# Patient Record
Sex: Female | Born: 1995 | Race: Black or African American | Hispanic: No | Marital: Single | Smoking: Never smoker
Health system: Southern US, Community
[De-identification: ages and names within clinical notes are randomized; demographics above are authoritative.]

## PROBLEM LIST (undated history)

## (undated) DIAGNOSIS — Z975 Presence of (intrauterine) contraceptive device: Secondary | ICD-10-CM

## (undated) HISTORY — DX: Presence of (intrauterine) contraceptive device: Z97.5

## (undated) HISTORY — PX: IUD REMOVAL: SHX5392

## (undated) HISTORY — PX: INTRAUTERINE DEVICE (IUD) INSERTION: SHX5877

---
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
## 2018-06-10 ENCOUNTER — Encounter: Payer: Self-pay | Admitting: Gastroenterology

## 2018-07-25 ENCOUNTER — Other Ambulatory Visit: Payer: BLUE CROSS/BLUE SHIELD

## 2018-07-25 ENCOUNTER — Other Ambulatory Visit (INDEPENDENT_AMBULATORY_CARE_PROVIDER_SITE_OTHER): Payer: BLUE CROSS/BLUE SHIELD

## 2018-07-25 ENCOUNTER — Ambulatory Visit (INDEPENDENT_AMBULATORY_CARE_PROVIDER_SITE_OTHER): Payer: BLUE CROSS/BLUE SHIELD | Admitting: Gastroenterology

## 2018-07-25 ENCOUNTER — Encounter: Payer: Self-pay | Admitting: Gastroenterology

## 2018-07-25 ENCOUNTER — Encounter (INDEPENDENT_AMBULATORY_CARE_PROVIDER_SITE_OTHER): Payer: Self-pay

## 2018-07-25 VITALS — BP 118/78 | HR 76 | Ht 66.0 in | Wt 164.0 lb

## 2018-07-25 DIAGNOSIS — R1115 Cyclical vomiting syndrome unrelated to migraine: Secondary | ICD-10-CM

## 2018-07-25 DIAGNOSIS — K5909 Other constipation: Secondary | ICD-10-CM

## 2018-07-25 DIAGNOSIS — R634 Abnormal weight loss: Secondary | ICD-10-CM

## 2018-07-25 DIAGNOSIS — R1084 Generalized abdominal pain: Secondary | ICD-10-CM

## 2018-07-25 DIAGNOSIS — G43A0 Cyclical vomiting, not intractable: Secondary | ICD-10-CM | POA: Diagnosis not present

## 2018-07-25 LAB — COMPREHENSIVE METABOLIC PANEL
ALT: 6 U/L (ref 0–35)
AST: 10 U/L (ref 0–37)
Albumin: 4.2 g/dL (ref 3.5–5.2)
Alkaline Phosphatase: 45 U/L (ref 39–117)
BUN: 8 mg/dL (ref 6–23)
CO2: 28 mEq/L (ref 19–32)
Calcium: 9.5 mg/dL (ref 8.4–10.5)
Chloride: 104 mEq/L (ref 96–112)
Creatinine, Ser: 0.83 mg/dL (ref 0.40–1.20)
GFR: 111.42 mL/min (ref >60.00)
Glucose, Bld: 96 mg/dL (ref 70–99)
Potassium: 4.2 mEq/L (ref 3.5–5.1)
Sodium: 138 mEq/L (ref 135–145)
Total Bilirubin: 0.4 mg/dL (ref 0.2–1.2)
Total Protein: 7.3 g/dL (ref 6.0–8.3)

## 2018-07-25 LAB — CBC WITH DIFFERENTIAL/PLATELET
BASOS ABS: 0 10*3/uL (ref 0.0–0.1)
Basophils Relative: 0.5 % (ref 0.0–3.0)
EOS ABS: 0 10*3/uL (ref 0.0–0.7)
Eosinophils Relative: 0.8 % (ref 0.0–5.0)
HEMATOCRIT: 37 % (ref 36.0–46.0)
HEMOGLOBIN: 12.1 g/dL (ref 12.0–15.0)
Lymphocytes Relative: 53.3 % — ABNORMAL HIGH (ref 12.0–46.0)
Lymphs Abs: 2 10*3/uL (ref 0.7–4.0)
MCHC: 32.8 g/dL (ref 30.0–36.0)
MCV: 93.3 fl (ref 78.0–100.0)
MONOS PCT: 5.7 % (ref 3.0–12.0)
Monocytes Absolute: 0.2 10*3/uL (ref 0.1–1.0)
Neutro Abs: 1.5 10*3/uL (ref 1.4–7.7)
Neutrophils Relative %: 39.7 % — ABNORMAL LOW (ref 43.0–77.0)
Platelets: 204 10*3/uL (ref 150.0–400.0)
RBC: 3.96 Mil/uL (ref 3.87–5.11)
RDW: 13.3 % (ref 11.5–15.5)
WBC: 3.8 10*3/uL — ABNORMAL LOW (ref 4.0–10.5)

## 2018-07-25 LAB — TSH: TSH: 0.64 u[IU]/mL (ref 0.35–4.50)

## 2018-07-25 MED ORDER — ONDANSETRON HCL 8 MG PO TABS: 8.0000 mg | ORAL_TABLET | Freq: Three times a day (TID) | ORAL | 2 refills | Status: DC | PRN

## 2018-07-25 NOTE — Progress Notes (Addendum)
[facility] Consult Note:  History: Erin Bowman 07/25/2018  Referring physician: self referred  Reason for consult/chief complaint: Abdominal Pain (with eating); Weight Loss (orig. weight was 215); Constipation (hard to pass BMs); and Emesis (every morning)   Subjective  HPI:  This is a 22 year old woman self-referred for vomiting and weight loss.  This has apparently been going on for at least 9 months, and she does not have a regular medical physician did not seek any medical care for until she went to an urgent care while visiting family in [place] in May of this year.  She says a stool test was done, and she was given prescriptions for omeprazole and ondansetron.  The nausea medicine has helped, but she ran out of it a while ago, and wakes up every morning almost like clockwork at 7:00 with 1 or 2 episodes of vomiting liquid.  She does not vomit undigested food.  She has generalized abdominal dull discomfort that is sometimes worse with meals, and sometimes better with marijuana use.  She tends toward constipation, has bloating and gas loss of appetite.  She also reports substantial weight loss, stating that at some point she was 215 pounds, though it sounds like this may have been a few years back.  She believes she has lost at least 40 pounds over the last year.  She attributes that to this daily vomiting.  She has been a regular user of marijuana daily for years and says she takes it to help settle her stomach.   ROS:  Review of Systems  Constitutional: Negative for appetite change and unexpected weight change.  HENT: Negative for mouth sores and voice change.   Eyes: Negative for pain and redness.  Respiratory: Negative for cough and shortness of breath.   Cardiovascular: Negative for chest pain and palpitations.  Genitourinary: Negative for dysuria and hematuria.  Musculoskeletal: Negative for arthralgias and myalgias.  Skin: Negative for pallor and rash.        She has noticed a "lump" in the left axilla for the last couple of months.  She reports shaving and plucking that area frequently.  Neurological: Negative for weakness and headaches.  Hematological: Negative for adenopathy.   Menses light with IUD  Past Medical History: Past Medical History:  Diagnosis Date  . IUD contraception      Past Surgical History: Past Surgical History:  Procedure Laterality Date  . INTRAUTERINE DEVICE (IUD) INSERTION       Family History: Family History  Problem Relation Age of Onset  . Stomach cancer Maternal Grandfather   . Colon cancer Neg Hx     Social History: Social History   Socioeconomic History  . Marital status: Single    Spouse name: Not on file  . Number of children: Not on file  . Years of education: Not on file  . Highest education level: Not on file  Occupational History  . Not on file  Social Needs  . Financial resource strain: Not on file  . Food insecurity:    Worry: Not on file    Inability: Not on file  . Transportation needs:    Medical: Not on file    Non-medical: Not on file  Tobacco Use  . Smoking status: Current Every Day Smoker  . Smokeless tobacco: Never Used  Substance and Sexual Activity  . Alcohol use: No    Alcohol/week: 0.0 standard drinks  . Drug use: Yes    Types: Marijuana    Comment:  uses daily  . Sexual activity: Yes    Partners: Male  Lifestyle  . Physical activity:    Days per week: Not on file    Minutes per session: Not on file  . Stress: Not on file  Relationships  . Social connections:    Talks on phone: Not on file    Gets together: Not on file    Attends religious service: Not on file    Active member of club or organization: Not on file    Attends meetings of clubs or organizations: Not on file    Relationship status: Not on file  Other Topics Concern  . Not on file  Social History Narrative  . 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 ondansetron (ZOFRAN) 8 MG tablet Take 1 tablet (8 mg total) by mouth every 8 (eight) hours as needed for nausea or vomiting. 45 tablet 2   No current facility-administered medications for this visit.       ___________________________________________________________________ Objective   Exam:  BP 118/78   Pulse 76   Ht 5\' 6"  (1.676 m)   Wt 164 lb (74.4 kg)   BMI 26.47 kg/m  Boyfriend present for the entire encounter  General: this is a(n) well-appearing woman, normal vocal quality, no muscle wasting  Eyes: sclera anicteric, no redness  ENT: oral mucosa moist without lesions, no cervical or supraclavicular lymphadenopathy, good dentition  CV: RRR without murmur, S1/S2, no JVD, no peripheral edema  Resp: clear to auscultation bilaterally, normal RR and effort noted  GI: soft, generalized tenderness (more in the epigastrium and on right side), with active bowel sounds. No guarding or palpable organomegaly noted.  Skin; warm and dry, no rash or jaundice noted.  She has about a 5 x 3 cm soft tissue fullness in the left axilla, normal overlying skin.  It is a little tender.  No abnormality of the right axilla.  Neuro: awake, alert and oriented x 3. Normal gross motor function and fluent speech  Labs:  No recent data for review.  No outside records.  Assessment: Encounter Diagnoses  Name Primary?  . Generalized abdominal pain Yes  . Abnormal loss of weight   . Non-intractable cyclical vomiting with nausea   . Chronic constipation     22 year old woman with a constellation of GI symptoms dating back at least 9 months with substantial reported weight loss and daily vomiting.  She has generalized abdominal pain and altered bowel habits.  It is difficult to tie this altogether in a single diagnosis.  I have brought up the possibility of cannabis hyperemesis, and suggested she stop  using marijuana regularly.  There could be inflammatory, ulcer, neoplastic obstructive causes for symptoms.  Unlikely IBD without chronic diarrhea.  Plan:  CBC, CMP, TSH Upper endoscopy.  She is agreeable after discussion of procedure and risks.  The benefits and risks of the planned procedure were described in detail with the patient or (when appropriate) their health care proxy.  Risks were outlined as including, but not limited to, bleeding, infection, perforation, adverse medication reaction leading to cardiac or pulmonary decompensation, or pancreatitis (if ERCP).  The limitation of incomplete mucosal visualization was also discussed.  No guarantees or warranties were given.  CT abdomen and pelvis with contrast  Ondansetron 8 mg 1 tablet every 8 hours as needed.  I strongly advised her to  stop at the primary care clinic on the first floor of this building to make a new patient appointment for routine health maintenance and also evaluation of this left axillary abnormality.  Thank you for the courtesy of this consult.  Please call me with any questions or concerns.  Charlie Pitter III

## 2018-07-25 NOTE — Patient Instructions (Addendum)
If you are age 21 or older, your body mass index should be between 23-30. Your Body mass index is 26.47 kg/m. If this is out of the aforementioned range listed, please consider follow up with your Primary Care Provider.  If you are age 51 or younger, your body mass index should be between 19-25. Your Body mass index is 26.47 kg/m. If this is out of the aformentioned range listed, please consider follow up with your Primary Care Provider.   You have been scheduled for an endoscopy. Please follow written instructions given to you at your visit today. If you use inhalers (even only as needed), please bring them with you on the day of your procedure. Your physician has requested that you go to www.startemmi.com and enter the access code given to you at your visit today. This web site gives a general overview about your procedure. However, you should still follow specific instructions given to you by our office regarding your preparation for the procedure.  Your provider has requested that you go to the basement level for lab work before leaving today. Press "B" on the elevator. The lab is located at the first door on the left as you exit the elevator.  You have been scheduled for a CT scan of the abdomen and pelvis at [facility] ([address]---this is in the same building as Press [facility]).   You are scheduled on 07-28-2018 at 1130am. You should arrive 15 minutes prior to your appointment time for registration. Please follow the written instructions below on the day of your exam:  WARNING: IF YOU ARE ALLERGIC TO IODINE/X-RAY DYE, PLEASE NOTIFY RADIOLOGY IMMEDIATELY AT [phone]! YOU WILL BE GIVEN A 13 HOUR PREMEDICATION PREP.  1) Do not eat or drink anything after 730am (4 hours prior to your test) 2) You have been given 2 bottles of oral contrast to drink. The solution may taste better if refrigerated, but do NOT add ice or any other liquid to this solution. Shake well before  drinking.    Drink 1 bottle of contrast @ 930am (2 hours prior to your exam)  Drink 1 bottle of contrast @ 1030am (1 hour prior to your exam)  You may take any medications as prescribed with a small amount of water except for the following: Metformin, Glucophage, Glucovance, Avandamet, Riomet, Fortamet, Actoplus Met, Janumet, Glumetza or Metaglip. The above medications must be held the day of the exam AND 48 hours after the exam.  The purpose of you drinking the oral contrast is to aid in the visualization of your intestinal tract. The contrast solution may cause some diarrhea. Before your exam is started, you will be given a small amount of fluid to drink. Depending on your individual set of symptoms, you may also receive an intravenous injection of x-ray contrast/dye. Plan on being at [facility] for 30 minutes or longer, depending on the type of exam you are having performed.  This test typically takes 30-45 minutes to complete.  If you have any questions regarding your exam or if you need to reschedule, you may call the CT department at [phone] between the hours of 8:00 am and 5:00 pm, Monday-Friday.  ________________________________________________________________________    It was a pleasure to see you today!  Dr. Loletha Carrow

## 2018-07-28 ENCOUNTER — Ambulatory Visit (INDEPENDENT_AMBULATORY_CARE_PROVIDER_SITE_OTHER)
Admission: RE | Admit: 2018-07-28 | Discharge: 2018-07-28 | Disposition: A | Discharge status: Home / Self Care | Payer: BLUE CROSS/BLUE SHIELD | Source: Ambulatory Visit | Attending: Gastroenterology | Admitting: Gastroenterology

## 2018-07-28 DIAGNOSIS — R1084 Generalized abdominal pain: Secondary | ICD-10-CM

## 2018-07-28 DIAGNOSIS — R1115 Cyclical vomiting syndrome unrelated to migraine: Secondary | ICD-10-CM

## 2018-07-28 DIAGNOSIS — R634 Abnormal weight loss: Secondary | ICD-10-CM

## 2018-07-28 MED ORDER — IOPAMIDOL (ISOVUE-300) INJECTION 61%
100.0000 mL | Freq: Once | INTRAVENOUS | Status: AC | PRN
  Administered 2018-07-28: 100 mL via INTRAVENOUS

## 2018-07-31 ENCOUNTER — Telehealth: Payer: Self-pay

## 2018-07-31 NOTE — Telephone Encounter (Signed)
CT report sent to Dr Charleen KirksHaygoods office by L.Hunt for consult on IUD misplaced. Per Dr Charleen KirksHaygoods office she is no longer a current patient. Called and confirmed with patient. [facility] [address] [facility]

## 2018-08-05 NOTE — Telephone Encounter (Signed)
Understood, thanks.  Appreciate you setting the patient up for some help.

## 2018-08-21 ENCOUNTER — Encounter: Payer: Self-pay | Admitting: Gastroenterology

## 2018-08-21 ENCOUNTER — Ambulatory Visit (AMBULATORY_SURGERY_CENTER): Payer: BLUE CROSS/BLUE SHIELD | Admitting: Gastroenterology

## 2018-08-21 VITALS — BP 98/53 | HR 59 | Temp 98.4°F | Resp 9 | Ht 66.0 in | Wt 164.0 lb

## 2018-08-21 DIAGNOSIS — R1084 Generalized abdominal pain: Secondary | ICD-10-CM

## 2018-08-21 DIAGNOSIS — R112 Nausea with vomiting, unspecified: Secondary | ICD-10-CM

## 2018-08-21 MED ORDER — SODIUM CHLORIDE 0.9 % IV SOLN: 500.0000 mL | Freq: Once | INTRAVENOUS | Status: DC

## 2018-08-21 NOTE — Op Note (Signed)
[facility] Patient Name: Erin Bowman Procedure Date: 08/21/2018 10:11 AM MRN: [id number] Endoscopist: Sherilyn Cooter L. Myrtie Neither , MD Age: 22 Referring MD:  Date of Birth: [birth year] Gender: Female Account #: [id number] Procedure:                Upper GI endoscopy Indications:              Generalized abdominal pain, Nausea with vomiting,                            Weight loss (normal CTAP, CMP, Albumin, CBC, TSH) Medicines:                Monitored Anesthesia Care Procedure:                Pre-Anesthesia Assessment:                           - Prior to the procedure, a History and Physical                            was performed, and patient medications and                            allergies were reviewed. The patient's tolerance of                            previous anesthesia was also reviewed. The risks                            and benefits of the procedure and the sedation                            options and risks were discussed with the patient.                            All questions were answered, and informed consent                            was obtained. Prior Anticoagulants: The patient has                            taken no previous anticoagulant or antiplatelet                            agents. ASA Grade Assessment: II - A patient with                            mild systemic disease. After reviewing the risks                            and benefits, the patient was deemed in                            satisfactory condition to undergo the procedure.  After obtaining informed consent, the endoscope was                            passed under direct vision. Throughout the                            procedure, the patient's blood pressure, pulse, and                            oxygen saturations were monitored continuously. The                            Endoscope was introduced through the mouth, and   advanced to the second part of duodenum. The upper                            GI endoscopy was accomplished without difficulty.                            The patient tolerated the procedure well. Scope In: Scope Out: Findings:                 The larynx was normal.                           The esophagus was normal.                           The entire examined stomach was normal. This was                            biopsied with a cold forceps for Helicobacter                            pylori testing using CLOtest.                           The cardia and gastric fundus were normal on                            retroflexion.                           The examined duodenum was normal. Complications:            No immediate complications. Estimated Blood Loss:     Estimated blood loss was minimal. Impression:               - Normal larynx.                           - Normal esophagus.                           - Normal stomach. Biopsied.                           - Normal examined duodenum. Recommendation:           -  Patient has a contact number available for                            emergencies. The signs and symptoms of potential                            delayed complications were discussed with the                            patient. Return to normal activities tomorrow.                            Written discharge instructions were provided to the                            patient.                           - Resume previous diet.                           - Continue present medications.                           - Await pathology results.                           - Stop cannabis use. Henry L. Myrtie Neitheranis, MD 08/21/2018 10:23:32 AM This report has been signed electronically.

## 2018-08-21 NOTE — Patient Instructions (Signed)
YOU HAD AN ENDOSCOPIC PROCEDURE TODAY AT THE [facility]:   Refer to the procedure report that was given to you for any specific questions about what was found during the examination.  If the procedure report does not answer your questions, please call your gastroenterologist to clarify.  If you requested that your care partner not be given the details of your procedure findings, then the procedure report has been included in a sealed envelope for you to review at your convenience later.  YOU SHOULD EXPECT: Some feelings of bloating in the abdomen. Passage of more gas than usual.  Walking can help get rid of the air that was put into your GI tract during the procedure and reduce the bloating. If you had a lower endoscopy (such as a colonoscopy or flexible sigmoidoscopy) you may notice spotting of blood in your stool or on the toilet paper. If you underwent a bowel prep for your procedure, you may not have a normal bowel movement for a few days.  Please Note:  You might notice some irritation and congestion in your nose or some drainage.  This is from the oxygen used during your procedure.  There is no need for concern and it should clear up in a day or so.  SYMPTOMS TO REPORT IMMEDIATELY:   Following upper endoscopy (EGD)  Vomiting of blood or coffee ground material  New chest pain or pain under the shoulder blades  Painful or persistently difficult swallowing  New shortness of breath  Fever of 100F or higher  Black, tarry-looking stools  For urgent or emergent issues, a gastroenterologist can be reached at any hour by calling [phone].   DIET:  We do recommend a small meal at first, but then you may proceed to your regular diet.  Drink plenty of fluids but you should avoid alcoholic beverages for 24 hours.  ACTIVITY:  You should plan to take it easy for the rest of today and you should NOT DRIVE or use heavy machinery until tomorrow (because of the sedation medicines used  during the test).    FOLLOW UP: Our staff will call the number listed on your records the next business day following your procedure to check on you and address any questions or concerns that you may have regarding the information given to you following your procedure. If we do not reach you, we will leave a message.  However, if you are feeling well and you are not experiencing any problems, there is no need to return our call.  We will assume that you have returned to your regular daily activities without incident.  If any biopsies were taken you will be contacted by phone or by letter within the next 1-3 weeks.  Please call us at [phone] if you have not heard about the biopsies in 3 weeks.    SIGNATURES/CONFIDENTIALITY: You and/or your care partner have signed paperwork which will be entered into your electronic medical record.  These signatures attest to the fact that that the information above on your After Visit Summary has been reviewed and is understood.  Full responsibility of the confidentiality of this discharge information lies with you and/or your care-partner. 

## 2018-08-21 NOTE — Progress Notes (Signed)
Called to room to assist during endoscopic procedure.  Patient ID and intended procedure confirmed with present staff. Received instructions for my participation in the procedure from the performing physician.  

## 2018-08-21 NOTE — Progress Notes (Signed)
Report given to PACU, vss 

## 2018-08-22 ENCOUNTER — Telehealth: Payer: Self-pay

## 2018-08-22 ENCOUNTER — Telehealth: Payer: Self-pay | Admitting: *Deleted

## 2018-08-22 LAB — HELICOBACTER PYLORI SCREEN-BIOPSY: UREASE: NEGATIVE

## 2018-08-22 NOTE — Telephone Encounter (Signed)
Second post procedure phone call, no answer 

## 2018-08-22 NOTE — Telephone Encounter (Signed)
  Follow up Call-  Call back number 08/21/2018  Post procedure Call Back phone  # [phone]  Permission to leave phone message Yes  Some recent data might be hidden     Patient questions:  Message left to call us if necessary.

## 2018-10-09 ENCOUNTER — Telehealth: Payer: Self-pay | Admitting: Gastroenterology

## 2018-10-09 NOTE — Telephone Encounter (Signed)
Spoke to patient, she states that every morning she feels sick and vomits. She does not have an appetite, states she can "go 2 days and not eat". She is taking the Zofran, helps some. She has stopped cannabis use. She went to urgent care and they recommended she contact our office.

## 2018-10-09 NOTE — Telephone Encounter (Signed)
Please schedule non-contrast CT scan of the head and a Gastric emptying study, then follow up with me.

## 2018-10-09 NOTE — Telephone Encounter (Signed)
Patient states she is still sick and keeps vomiting. Patient wanting advice. Pt had egd 9.19.19.

## 2018-10-10 ENCOUNTER — Other Ambulatory Visit: Payer: Self-pay

## 2018-10-10 DIAGNOSIS — R112 Nausea with vomiting, unspecified: Secondary | ICD-10-CM

## 2018-10-10 NOTE — Telephone Encounter (Signed)
Patient scheduled for tests and follow up visit. Mailed her appointment information, also verbally gave her instructions.

## 2018-10-24 ENCOUNTER — Ambulatory Visit (HOSPITAL_COMMUNITY)
Admission: RE | Admit: 2018-10-24 | Discharge: 2018-10-24 | Disposition: A | Discharge status: Home / Self Care | Payer: BLUE CROSS/BLUE SHIELD | Source: Ambulatory Visit | Attending: Gastroenterology | Admitting: Gastroenterology

## 2018-10-24 ENCOUNTER — Encounter (HOSPITAL_COMMUNITY)
Admission: RE | Admit: 2018-10-24 | Discharge: 2018-10-24 | Disposition: A | Discharge status: Home / Self Care | Payer: BLUE CROSS/BLUE SHIELD | Source: Ambulatory Visit | Attending: Gastroenterology | Admitting: Gastroenterology

## 2018-10-24 DIAGNOSIS — R112 Nausea with vomiting, unspecified: Secondary | ICD-10-CM | POA: Diagnosis not present

## 2018-10-24 MED ORDER — TECHNETIUM TC 99M SULFUR COLLOID
1.9500 | Freq: Once | INTRAVENOUS | Status: AC | PRN
  Administered 2018-10-24: 1.95 via INTRAVENOUS

## 2018-11-04 ENCOUNTER — Encounter: Payer: Self-pay | Admitting: Gastroenterology

## 2018-11-04 ENCOUNTER — Ambulatory Visit (INDEPENDENT_AMBULATORY_CARE_PROVIDER_SITE_OTHER): Payer: BLUE CROSS/BLUE SHIELD | Admitting: Gastroenterology

## 2018-11-04 VITALS — BP 104/78 | HR 83 | Ht 66.0 in | Wt 154.0 lb

## 2018-11-04 DIAGNOSIS — R1084 Generalized abdominal pain: Secondary | ICD-10-CM

## 2018-11-04 DIAGNOSIS — R112 Nausea with vomiting, unspecified: Secondary | ICD-10-CM | POA: Diagnosis not present

## 2018-11-04 MED ORDER — METOCLOPRAMIDE HCL 5 MG PO TABS: 5.0000 mg | ORAL_TABLET | ORAL | 0 refills | Status: DC

## 2018-11-04 NOTE — Progress Notes (Signed)
[facility] Progress Note  Chief Complaint: Vomiting and abdominal pain  Subjective  History:  Erin Bowman was seen in August for consultation regarding chronic abdominal pain and vomiting that almost always occurs in the morning.  I felt that chronic marijuana use might be contributing, and she has since stopped that.  Extensive labs, CT abdomen and pelvis, and upper endoscopy with CLOtest were normal.  She continued to have symptoms despite use of Zofran,she contacted our office, and further testing was scheduled.  She has since had a negative head CT and gastric emptying study.  Erin Bowman feels much the same as before, particularly with a.m. vomiting.  She still has some intermittent crampy abdominal pain that is fairly generalized with no clear triggers or relieving factors.  Bowel habits are regular.  She wakes early in the morning and feels nauseated, and within an hour she vomited several times.  Zofran 8 mg seems to decrease the severity, but it still happens every day.  She then feels gradually better, and by 11 in the morning is able to eat food including salad.  She is then good the rest of the day until symptoms recur the next morning. She completely stopped using marijuana.  Her appetite is improved and her weight stabilized.  ROS: Cardiovascular:  no chest pain Respiratory: no dyspnea Remainder of systems negative except as above The patient's Past Medical, Family and Social History were reviewed and are on file in the EMR.  Objective:  Med list reviewed  Current Outpatient Medications:  .  metoCLOPramide (REGLAN) 5 MG tablet, Take 1 tablet (5 mg total) by mouth every morning., Disp: 21 tablet, Rfl: 0 .  ondansetron (ZOFRAN) 8 MG tablet, Take 1 tablet (8 mg total) by mouth every 8 (eight) hours as needed for nausea or vomiting., Disp: 45 tablet, Rfl: 2   Vital signs in last 24 hrs: Vitals:   11/04/18 1406  BP: 104/78  Pulse: 83  SpO2: 98%    Physical Exam  She  is well-appearing with good muscle mass  HEENT: sclera anicteric, oral mucosa moist without lesions  Neck: supple, no thyromegaly, JVD or lymphadenopathy  Cardiac: RRR without murmurs, S1S2 heard, no peripheral edema  Pulm: clear to auscultation bilaterally, normal RR and effort noted  Abdomen: soft, no tenderness, with active bowel sounds. No guarding or palpable hepatosplenomegaly.  Skin; warm and dry, no jaundice or rash  Recent data:  Testing as noted above   @ASSESSMENTPLANBEGIN @ Assessment: Encounter Diagnoses  Name Primary?  . Nausea and vomiting, intractability of vomiting not specified, unspecified vomiting type Yes  . Generalized abdominal pain     Puzzling case of nausea with completely normal extensive work-up, exclusively occurs in the morning.  Wonder if she may have some gastric dysrhythmia functional disorder.  She denies chronic anxiety as a trigger.  Plan:  Brief trial of metoclopramide 5 mg at bedtime.  If it makes her feel jittery, she can take it instead in the morning upon awakening.  We will do this for 2 to 3 weeks.  She can take Zofran later in the morning if needed.  I discussed the possible risk of tardive dyskinesia, and both described and demonstrated what this might look like.  If it occurs, she should take 2 Benadryl tablets immediately and let us know.  If this is not helpful, then a trial of buspirone might be in order before considering [facility] consultation.  Call in 2 or 3 weeks with an update, follow-up  in 6 weeks.  Total time 25 minutes, over half spent face-to-face with patient in counseling and coordination of care.   Charlie PitterHenry L Danis III

## 2018-11-04 NOTE — Patient Instructions (Signed)
If you are age 21 or older, your body mass index should be between 23-30. Your Body mass index is 24.86 kg/m. If this is out of the aforementioned range listed, please consider follow up with your Primary Care Provider.  If you are age 21 or younger, your body mass index should be between 19-25. Your Body mass index is 24.86 kg/m. If this is out of the aformentioned range listed, please consider follow up with your Primary Care Provider.   We have sent the following medications to your pharmacy for you to pick up at your convenience:  Reglan   It was a pleasure to see you today!  Dr. Myrtie Neitheranis

## 2018-11-07 ENCOUNTER — Telehealth: Payer: Self-pay | Admitting: Gastroenterology

## 2018-11-07 NOTE — Telephone Encounter (Signed)
Pt calling stating that she took the reglan at night and could not get up in the morning, reports she missed work. Tried taking it in the am and could not work today. States medicine does not help and is requesting a note excusing her from work for yesterday and today. Please advise.

## 2018-11-07 NOTE — Telephone Encounter (Signed)
Sorry to hear the medicine did not agree with her. Yes, please provide a work excuse note as requested.    If she would like to proceed with referral to [facility] as discussed at recent visit, then please send the referral along with my notes and scope/imaging reports.

## 2018-11-07 NOTE — Telephone Encounter (Signed)
Pt aware, letter up front for pt to pick up. Pt states she is going to discuss referral with her mother and will call back and let us know if she wishes to proceed with referral.

## 2018-11-07 NOTE — Telephone Encounter (Signed)
Selinda MichaelsLinda Hunt is now the nurse for Dr. Myrtie Neitheranis.  Please route accordingly

## 2018-11-20 ENCOUNTER — Telehealth: Payer: Self-pay | Admitting: Gastroenterology

## 2018-11-20 NOTE — Telephone Encounter (Signed)
Attempted to call pt on home numberX2 and get busy signal. Attempted cell number and received message that voicemail box has not been set up yet. Will try again.

## 2018-11-20 NOTE — Telephone Encounter (Signed)
Attempted to call pt again and still getting busy signal.

## 2018-11-20 NOTE — Telephone Encounter (Signed)
Pt says she's having side effects from Reglan and it's affecting her work.

## 2018-11-21 NOTE — Telephone Encounter (Signed)
Attempted to call cell number and received message that voicemail box has not been set up yet.

## 2018-11-21 NOTE — Telephone Encounter (Signed)
Attempted to call pt again and still getting a busy signal.

## 2018-11-21 NOTE — Telephone Encounter (Signed)
Line busy

## 2018-11-24 ENCOUNTER — Telehealth: Payer: Self-pay | Admitting: Gastroenterology

## 2018-11-24 NOTE — Telephone Encounter (Signed)
See additional phone note. 

## 2018-11-24 NOTE — Telephone Encounter (Signed)
Pt called and states the reglan made her feel bad so she stopped taking it. Reports she is still vomiting in the mornings. States the zofran stops the vomiting but she still feels nauseated. Let pt know that Dr. Myrtie Neitheranis is out of the office until next week. Pt is aware and knows we will get back to her when Dr. Myrtie Neitheranis can review.

## 2018-11-29 NOTE — Telephone Encounter (Signed)
I am sorry to hear that the medicine did not agree with her.  If she would like to proceed with the referral to [facility] as we discussed, then please send referral and forward records to Dr. Gibson RampNyree Thorne at [facility].   Zofran refills as needed.

## 2018-12-05 NOTE — Telephone Encounter (Signed)
Records faxed to Dr. Gibson Ramp at [facility] [phone].

## 2018-12-09 ENCOUNTER — Telehealth: Payer: Self-pay

## 2018-12-09 NOTE — Telephone Encounter (Signed)
Pt has been scheduled for an appt with Dr. Jacinto Reap 01/27/19@9 :30am.

## 2018-12-31 ENCOUNTER — Ambulatory Visit: Payer: BLUE CROSS/BLUE SHIELD | Admitting: Family Medicine

## 2018-12-31 DIAGNOSIS — Z0289 Encounter for other administrative examinations: Secondary | ICD-10-CM

## 2019-05-19 ENCOUNTER — Telehealth: Payer: Self-pay

## 2019-05-19 NOTE — Telephone Encounter (Signed)
The patient called in to schedule an appointment. Informed of wearing a face mask, sanitzing of hands once she enters the clinic, no visitors due to the [place] restrictions. The patient verbalized understanding.

## 2019-05-28 ENCOUNTER — Ambulatory Visit: Payer: BLUE CROSS/BLUE SHIELD

## 2019-05-28 ENCOUNTER — Telehealth: Payer: Self-pay | Admitting: Family Medicine

## 2019-05-28 NOTE — Telephone Encounter (Signed)
Called patient to let to let her know that we had to cancel her appointment, and reschedule due to illness within the office

## 2019-06-03 ENCOUNTER — Ambulatory Visit: Payer: BLUE CROSS/BLUE SHIELD

## 2019-08-15 ENCOUNTER — Encounter (HOSPITAL_COMMUNITY): Payer: Self-pay | Admitting: Emergency Medicine

## 2019-08-15 ENCOUNTER — Emergency Department (HOSPITAL_COMMUNITY)
Admission: EM | Admit: 2019-08-15 | Discharge: 2019-08-15 | Disposition: A | Discharge status: Home / Self Care | Payer: BLUE CROSS/BLUE SHIELD | Attending: Emergency Medicine | Admitting: Emergency Medicine

## 2019-08-15 ENCOUNTER — Other Ambulatory Visit: Payer: Self-pay

## 2019-08-15 DIAGNOSIS — M546 Pain in thoracic spine: Secondary | ICD-10-CM

## 2019-08-15 DIAGNOSIS — Z79899 Other long term (current) drug therapy: Secondary | ICD-10-CM | POA: Insufficient documentation

## 2019-08-15 LAB — URINALYSIS, ROUTINE W REFLEX MICROSCOPIC
Bilirubin Urine: NEGATIVE
Glucose, UA: NEGATIVE mg/dL
Hgb urine dipstick: NEGATIVE
Ketones, ur: NEGATIVE mg/dL
Leukocytes,Ua: NEGATIVE
Nitrite: NEGATIVE
Protein, ur: NEGATIVE mg/dL
Specific Gravity, Urine: 1.011 (ref 1.005–1.030)
pH: 7 (ref 5.0–8.0)

## 2019-08-15 LAB — POC URINE PREG, ED: Preg Test, Ur: NEGATIVE

## 2019-08-15 MED ORDER — CYCLOBENZAPRINE HCL 5 MG PO TABS: 5.0000 mg | ORAL_TABLET | Freq: Three times a day (TID) | ORAL | 0 refills | Status: DC | PRN

## 2019-08-15 MED ORDER — NAPROXEN 375 MG PO TABS: 375.0000 mg | ORAL_TABLET | Freq: Two times a day (BID) | ORAL | 0 refills | Status: DC

## 2019-08-15 NOTE — ED Provider Notes (Signed)
[facility]-EMERGENCY DEPT Provider Note   CSN: [id number] Arrival date & time: 08/15/19  1023     History   Chief Complaint Chief Complaint  Patient presents with  . Back Pain    HPI Erin Bowman is a 23 y.o. female with no pertinent  PMH here today with c/o back pain. Patient reports back pain off and on for 2 years that got worse in the past couple weeks. Patient reports no loss of control of bladder or bowels. Took 2 ibuprofen yesterday without relief.      The history is provided by the patient.  Back Pain Location:  Thoracic spine Quality:  Burning, aching and stabbing Radiates to:  Does not radiate Pain severity:  Severe Pain is:  Same all the time Onset quality:  Gradual Duration:  2 weeks Chronicity:  Chronic Context: not emotional stress, not falling, not jumping from heights, not lifting heavy objects, not MVA, not occupational injury, not physical stress and not recent injury   Relieved by:  Nothing Worsened by:  Coughing, twisting and movement Ineffective treatments:  Ibuprofen Associated symptoms: no abdominal pain, no bladder incontinence, no bowel incontinence, no chest pain, no dysuria, no fever, no headaches, no leg pain, no numbness, no pelvic pain, no tingling, no weakness and no weight loss   Risk factors: lack of exercise   Risk factors: no hx of cancer, not pregnant, no recent surgery and no steroid use     Past Medical History:  Diagnosis Date  . IUD contraception     There are no active problems to display for this patient.   Past Surgical History:  Procedure Laterality Date  . INTRAUTERINE DEVICE (IUD) INSERTION    . IUD REMOVAL       OB History   No obstetric history on file.      Home Medications    Prior to Admission medications   Medication Sig Start Date End Date Taking? Authorizing Provider  cyclobenzaprine (FLEXERIL) 5 MG tablet Take 1 tablet (5 mg total) by mouth 3 (three) times daily as needed for  muscle spasms. 08/15/19   Janne NapoleonNeese, Etrulia Zarr M, NP  metoCLOPramide (REGLAN) 5 MG tablet Take 1 tablet (5 mg total) by mouth every morning. 11/04/18   Sherrilyn Ristanis, Henry L III, MD  naproxen (NAPROSYN) 375 MG tablet Take 1 tablet (375 mg total) by mouth 2 (two) times daily. 08/15/19   Janne NapoleonNeese, Kimberli Winne M, NP  ondansetron (ZOFRAN) 8 MG tablet Take 1 tablet (8 mg total) by mouth every 8 (eight) hours as needed for nausea or vomiting. 07/25/18   Sherrilyn Ristanis, Henry L III, MD    Family History Family History  Problem Relation Age of Onset  . Stomach cancer Maternal Grandfather   . Colon cancer Neg Hx   . Esophageal cancer Neg Hx   . Rectal cancer Neg Hx     Social History Social History   Tobacco Use  . Smoking status: Never Smoker  . Smokeless tobacco: Never Used  Substance Use Topics  . Alcohol use: No    Alcohol/week: 0.0 standard drinks  . Drug use: Yes    Types: Marijuana    Comment: uses daily     Allergies   Patient has no known allergies.   Review of Systems Review of Systems  Constitutional: Negative for chills, fever and weight loss.  HENT: Negative.   Eyes: Negative for discharge, redness and visual disturbance.  Respiratory: Negative for cough, chest tightness and shortness of breath.  Cardiovascular: Negative for chest pain.  Gastrointestinal: Negative for abdominal pain and bowel incontinence.  Genitourinary: Negative for bladder incontinence, dysuria, flank pain, frequency, pelvic pain, urgency, vaginal bleeding and vaginal discharge.  Musculoskeletal: Positive for back pain.  Skin: Negative for rash.  Neurological: Negative for tingling, weakness, numbness and headaches.  Psychiatric/Behavioral: Negative for confusion. The patient is not nervous/anxious.      Physical Exam Updated Vital Signs BP 116/68 (BP Location: Left Arm)   Pulse 74   Temp 98.2 F (36.8 C) (Oral)   Resp 17   LMP 07/17/2019   SpO2 100%   Physical Exam Vitals signs and nursing note reviewed.   Constitutional:      Appearance: She is well-developed and normal weight.  HENT:     Head: Normocephalic.  Eyes:     Conjunctiva/sclera: Conjunctivae normal.  Neck:     Musculoskeletal: Normal range of motion and neck supple.  Cardiovascular:     Rate and Rhythm: Normal rate and regular rhythm.  Pulmonary:     Effort: Pulmonary effort is normal.     Breath sounds: Normal breath sounds.  Abdominal:     General: Bowel sounds are normal.     Palpations: Abdomen is soft.     Tenderness: There is no abdominal tenderness.  Musculoskeletal:     Thoracic back: She exhibits tenderness. She exhibits normal range of motion, no deformity, no laceration, no spasm and normal pulse.     Comments: Radial and pedal pulses 2+, straight leg raises without difficulty. Full range of motion of back without difficulty but patient states pain with bending forward and twisting.   Skin:    General: Skin is warm and dry.  Neurological:     Mental Status: She is alert and oriented to person, place, and time.     Sensory: Sensation is intact.     Motor: No weakness.     Coordination: Romberg sign negative.     Gait: Gait is intact.     Deep Tendon Reflexes:     Reflex Scores:      Bicep reflexes are 2+ on the right side and 2+ on the left side.      Brachioradialis reflexes are 2+ on the right side and 2+ on the left side.      Patellar reflexes are 2+ on the right side and 2+ on the left side. Psychiatric:        Mood and Affect: Mood normal.      ED Treatments / Results  Labs (all labs ordered are listed, but only abnormal results are displayed) Labs Reviewed  URINALYSIS, [place] PREG, ED    Radiology No results found.  Procedures Procedures (including critical care time)  Medications Ordered in ED Medications - No data to display   Initial Impression / Assessment and Plan / ED Course  I have reviewed the triage vital signs and the nursing notes.  Patient with back pain.  No neurological deficits and normal neuro exam.  Patient can walk without pain.  No loss of bowel or bladder control.  No concern for cauda equina.  No fever, night sweats, weight loss, h/o cancer, IVDU.  RICE protocol and pain medicine indicated and discussed with patient.  23 y.o. female here with back pain that has been off x 2 years and for the past 2 weeks has had upper back pain. Stable for d/c Will treat with muscle relaxer and NSAIDS. Return precautions.  Final Clinical Impressions(s) / ED Diagnoses   Final diagnoses:  Acute bilateral thoracic back pain    ED Discharge Orders         Ordered    cyclobenzaprine (FLEXERIL) 5 MG tablet  3 times daily PRN     08/15/19 1151    naproxen (NAPROSYN) 375 MG tablet  2 times daily     08/15/19 1151           [place], [place], NP 08/15/19 1200    Melene Plan, DO 08/15/19 1219

## 2019-08-15 NOTE — ED Triage Notes (Signed)
Pt c/o back pains x 2 years that got worse in the past couple weeks. Pt denies any new fall or injuries.

## 2019-08-15 NOTE — Discharge Instructions (Addendum)
Take the medication as directed. Do not take the muscle relaxer if driving as it will make you sleepy. Follow up with [facility]. Return here as needed.

## 2019-10-01 ENCOUNTER — Emergency Department (HOSPITAL_COMMUNITY): Payer: Self-pay

## 2019-10-01 ENCOUNTER — Other Ambulatory Visit: Payer: Self-pay

## 2019-10-01 ENCOUNTER — Encounter (HOSPITAL_COMMUNITY): Payer: Self-pay | Admitting: Emergency Medicine

## 2019-10-01 ENCOUNTER — Emergency Department (HOSPITAL_COMMUNITY)
Admission: EM | Admit: 2019-10-01 | Discharge: 2019-10-01 | Disposition: A | Discharge status: Home / Self Care | Payer: Self-pay | Attending: Emergency Medicine | Admitting: Emergency Medicine

## 2019-10-01 DIAGNOSIS — M545 Low back pain, unspecified: Secondary | ICD-10-CM

## 2019-10-01 DIAGNOSIS — G8929 Other chronic pain: Secondary | ICD-10-CM | POA: Insufficient documentation

## 2019-10-01 DIAGNOSIS — Z791 Long term (current) use of non-steroidal anti-inflammatories (NSAID): Secondary | ICD-10-CM | POA: Insufficient documentation

## 2019-10-01 DIAGNOSIS — Z975 Presence of (intrauterine) contraceptive device: Secondary | ICD-10-CM | POA: Insufficient documentation

## 2019-10-01 DIAGNOSIS — Z79899 Other long term (current) drug therapy: Secondary | ICD-10-CM | POA: Insufficient documentation

## 2019-10-01 LAB — I-STAT BETA HCG BLOOD, ED (MC, WL, AP ONLY): I-stat hCG, quantitative: <5 m[IU]/mL (ref <5)

## 2019-10-01 MED ORDER — IBUPROFEN 800 MG PO TABS
800.0000 mg | ORAL_TABLET | Freq: Once | ORAL | Status: AC
  Administered 2019-10-01: 800 mg via ORAL
  Filled 2019-10-01: qty 1

## 2019-10-01 MED ORDER — DICLOFENAC SODIUM 1 % TD GEL: 2.0000 g | Freq: Four times a day (QID) | TRANSDERMAL | 0 refills | Status: DC

## 2019-10-01 NOTE — ED Provider Notes (Signed)
[facility] DEPT Provider Note   CSN: [id number] Arrival date & time: 10/01/19  1120     History   Chief Complaint Chief Complaint  Patient presents with  . Back Pain    HPI Erin Bowman is a 23 y.o. female with no significant past medical history who presents to the ED due to chronic low back pain x 1 year. Patient notes her pain is a constant, burning sensation in her low back and mid back. Patient was seen in the ED in September for the same issue and was discharged home with symptomatic treatment, but she notes her pain has not improved. She has tried OTC back pain relief with no relief. She notes cracking her back causes relief. Pain is worse when she first wakes up in the morning x 6 months. She denies hx cancer, IVDU, numbess/tingling, fever, chills. No injects in back. No recent steroid use. She does note she has had a significant amount of weight loss over the past 8 months, but was evaluated by GI who found no cause. She also notes chronic shortness of breath and cough x 6 months. Patient denies bowel/bladder incontinence, saddle paresthesias, chest pain, and abdominal pain.   Past Medical History:  Diagnosis Date  . IUD contraception     There are no active problems to display for this patient.   Past Surgical History:  Procedure Laterality Date  . INTRAUTERINE DEVICE (IUD) INSERTION    . IUD REMOVAL       OB History   No obstetric history on file.      Home Medications    Prior to Admission medications   Medication Sig Start Date End Date Taking? Authorizing Provider  cyclobenzaprine (FLEXERIL) 5 MG tablet Take 1 tablet (5 mg total) by mouth 3 (three) times daily as needed for muscle spasms. 08/15/19   Ashley Murrain, NP  diclofenac sodium (VOLTAREN) 1 % GEL Apply 2 g topically 4 (four) times daily. 10/01/19   Cheek, Comer Locket, PA-C  metoCLOPramide (REGLAN) 5 MG tablet Take 1 tablet (5 mg total) by mouth every morning. 11/04/18    Doran Stabler, MD  naproxen (NAPROSYN) 375 MG tablet Take 1 tablet (375 mg total) by mouth 2 (two) times daily. 08/15/19   Ashley Murrain, NP  ondansetron (ZOFRAN) 8 MG tablet Take 1 tablet (8 mg total) by mouth every 8 (eight) hours as needed for nausea or vomiting. 07/25/18   Doran Stabler, MD    Family History Family History  Problem Relation Age of Onset  . Stomach cancer Maternal Grandfather   . Colon cancer Neg Hx   . Esophageal cancer Neg Hx   . Rectal cancer Neg Hx     Social History Social History   Tobacco Use  . Smoking status: Never Smoker  . Smokeless tobacco: Never Used  Substance Use Topics  . Alcohol use: No    Alcohol/week: 0.0 standard drinks  . Drug use: Yes    Types: Marijuana    Comment: uses daily     Allergies   Patient has no known allergies.   Review of Systems Review of Systems  Constitutional: Negative for chills and fever.  Respiratory: Negative for shortness of breath.   Cardiovascular: Negative for chest pain.  Gastrointestinal: Negative for abdominal pain.  Musculoskeletal: Positive for back pain and myalgias. Negative for gait problem, neck pain and neck stiffness.  Skin: Negative for color change.  All other systems reviewed and  are negative.    Physical Exam Updated Vital Signs BP 122/81   Pulse 86   Temp 98.5 F (36.9 C)   Resp 14   SpO2 98%   Physical Exam Constitutional:      General: She is not in acute distress.    Appearance: She is not ill-appearing.  HENT:     Head: Normocephalic.  Neck:     Musculoskeletal: Neck supple.  Cardiovascular:     Rate and Rhythm: Normal rate and regular rhythm.     Pulses: Normal pulses.     Heart sounds: Normal heart sounds. No murmur. No friction rub. No gallop.   Pulmonary:     Effort: Pulmonary effort is normal.     Breath sounds: Normal breath sounds.  Abdominal:     General: Abdomen is flat. Bowel sounds are normal. There is no distension.     Palpations: Abdomen  is soft.  Musculoskeletal:     Comments: Midline tenderness in thoracic and lumbar regions. More significant in paraspinal region. No deformity. No step-off. Full ROM of back. Ambulates without difficulty. Negative straight leg test. Distal pulses and sensation intact bilaterally.  Skin:    General: Skin is warm.  Neurological:     General: No focal deficit present.     Mental Status: She is alert.      ED Treatments / Results  Labs (all labs ordered are listed, but only abnormal results are displayed) Labs Reviewed  I-STAT BETA HCG BLOOD, ED ([facility], [facility], [facility] ONLY)    EKG None  Radiology Dg Chest 2 View  Result Date: 10/01/2019 CLINICAL DATA:  Shortness of breath and cough for 6 months EXAM: CHEST - 2 VIEW COMPARISON:  None. FINDINGS: The heart size and mediastinal contours are within normal limits. Both lungs are clear. The visualized skeletal structures are unremarkable. IMPRESSION: No active cardiopulmonary disease. Electronically Signed   By: Jonna Clark M.D.   On: 10/01/2019 13:13   Dg Lumbar Spine Complete  Result Date: 10/01/2019 CLINICAL DATA:  Lower back pain EXAM: LUMBAR SPINE - COMPLETE 4+ VIEW COMPARISON:  None. FINDINGS: There is no evidence of lumbar spine fracture. Alignment is normal. Intervertebral disc spaces are maintained. IMPRESSION: Negative. Electronically Signed   By: Jonna Clark M.D.   On: 10/01/2019 13:15    Procedures Procedures (including critical care time)  Medications Ordered in ED Medications  ibuprofen (ADVIL) tablet 800 mg (800 mg Oral Given 10/01/19 1237)     Initial Impression / Assessment and Plan / ED Course  I have reviewed the triage vital signs and the nursing notes.  Pertinent labs & imaging results that were available during my care of the patient were reviewed by me and considered in my medical decision making (see chart for details).       Erin Bowman is a 23 year old female who presents to the ED for an evaluation of low  back pain x 1 year. Patient denies trauma. She was seen in the ED 2 months ago and treated symptomatically for back pain. Vitals reviewed and all WNL. Patient is in no acute distress and non-ill appearing. On physical exam patient has midline tenderness in thoracic and lumbar region, but more significant tenderness in paraspinal muscle region. Full back ROM. Able to ambulate without difficulty. Patient is pretty addiment about getting an x-ray. Will obtain lumbar x-ray. Given she has had a chronic cough x 6 months will order CXR.  I have personally reviewed the films. CXR negative.  Lumbar x-ray negative for bony fractures. Discussed results with patient. Will treat symptomatically with voltaren gel with neurosurgery follow-up. Advised patient to follow-up with PCP to evaluate weight loss further. Strict ED precautions discussed with patient. Patient states understanding and agrees to plan. Patient discharged home in no acute distress and vitals within normal limits.  Discussed case with Dr. Silverio LayYao who agrees with assessment and plan Final Clinical Impressions(s) / ED Diagnoses   Final diagnoses:  Chronic bilateral low back pain without sciatica    ED Discharge Orders         Ordered    diclofenac sodium (VOLTAREN) 1 % GEL  4 times daily     10/01/19 1324           Renee HarderCheek, Caroline B, PA-C 10/01/19 1732    Charlynne PanderYao, David Hsienta, MD 10/02/19 [phone]

## 2019-10-01 NOTE — ED Triage Notes (Signed)
Patient reports back pain x1 year. States seen x2 months ago and prescribed muscle relaxer without relief. Denies urinary symptoms. Ambulatory.

## 2019-10-01 NOTE — ED Notes (Signed)
ED Provider at bedside. 

## 2019-10-01 NOTE — ED Notes (Signed)
Patient transported to X-ray 

## 2019-10-01 NOTE — Discharge Instructions (Signed)
As discussed, you can take ibuprofen for back pain. I am prescribing you voltaren gel. Apply to the affected areas as needed for pain. Call and make an appointment with neurosurgery for further evaluation of back pain. I am also giving you a number for a primary care doctor. Call and make an appointment for further evaluation of chronic cough and weight loss. Do back exercises daily that I have provided.

## 2019-11-13 ENCOUNTER — Other Ambulatory Visit: Payer: Self-pay

## 2019-11-13 ENCOUNTER — Emergency Department (HOSPITAL_COMMUNITY)
Admission: EM | Admit: 2019-11-13 | Discharge: 2019-11-13 | Disposition: A | Discharge status: Home / Self Care | Payer: Managed Care, Other (non HMO) | Attending: Emergency Medicine | Admitting: Emergency Medicine

## 2019-11-13 ENCOUNTER — Emergency Department (HOSPITAL_COMMUNITY): Payer: Managed Care, Other (non HMO)

## 2019-11-13 ENCOUNTER — Encounter (HOSPITAL_COMMUNITY): Payer: Self-pay | Admitting: Emergency Medicine

## 2019-11-13 DIAGNOSIS — M546 Pain in thoracic spine: Secondary | ICD-10-CM | POA: Diagnosis not present

## 2019-11-13 DIAGNOSIS — Z79899 Other long term (current) drug therapy: Secondary | ICD-10-CM | POA: Insufficient documentation

## 2019-11-13 DIAGNOSIS — R519 Headache, unspecified: Secondary | ICD-10-CM | POA: Diagnosis not present

## 2019-11-13 DIAGNOSIS — Z975 Presence of (intrauterine) contraceptive device: Secondary | ICD-10-CM | POA: Diagnosis not present

## 2019-11-13 DIAGNOSIS — Y939 Activity, unspecified: Secondary | ICD-10-CM | POA: Diagnosis not present

## 2019-11-13 DIAGNOSIS — M542 Cervicalgia: Secondary | ICD-10-CM | POA: Diagnosis present

## 2019-11-13 DIAGNOSIS — Y929 Unspecified place or not applicable: Secondary | ICD-10-CM | POA: Insufficient documentation

## 2019-11-13 DIAGNOSIS — Y999 Unspecified external cause status: Secondary | ICD-10-CM | POA: Insufficient documentation

## 2019-11-13 MED ORDER — NAPROXEN 500 MG PO TABS: 500.0000 mg | ORAL_TABLET | Freq: Two times a day (BID) | ORAL | 0 refills | Status: DC

## 2019-11-13 MED ORDER — METHOCARBAMOL 500 MG PO TABS: 500.0000 mg | ORAL_TABLET | Freq: Two times a day (BID) | ORAL | 0 refills | Status: DC

## 2019-11-13 MED ORDER — ACETAMINOPHEN 500 MG PO TABS
1000.0000 mg | ORAL_TABLET | Freq: Once | ORAL | Status: AC
  Administered 2019-11-13: 1000 mg via ORAL
  Filled 2019-11-13: qty 2

## 2019-11-13 NOTE — Discharge Instructions (Addendum)
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
## 2020-06-13 ENCOUNTER — Other Ambulatory Visit: Payer: Self-pay

## 2020-06-13 ENCOUNTER — Emergency Department (HOSPITAL_COMMUNITY)
Admission: EM | Admit: 2020-06-13 | Discharge: 2020-06-13 | Disposition: A | Discharge status: Home / Self Care | Payer: Managed Care, Other (non HMO) | Attending: Emergency Medicine | Admitting: Emergency Medicine

## 2020-06-13 ENCOUNTER — Encounter (HOSPITAL_COMMUNITY): Payer: Self-pay

## 2020-06-13 DIAGNOSIS — L02412 Cutaneous abscess of left axilla: Secondary | ICD-10-CM | POA: Insufficient documentation

## 2020-06-13 MED ORDER — CEPHALEXIN 500 MG PO CAPS: 500.0000 mg | ORAL_CAPSULE | Freq: Three times a day (TID) | ORAL | 0 refills | Status: AC

## 2020-06-13 MED ORDER — LIDOCAINE-EPINEPHRINE 2 %-1:100000 IJ SOLN
5.0000 mL | Freq: Once | INTRAMUSCULAR | Status: AC
  Administered 2020-06-13: 5 mL via INTRADERMAL
  Filled 2020-06-13: qty 1
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
## 2020-06-13 NOTE — ED Triage Notes (Signed)
Pt presents with c/o abscess under her left arm. Pt has a raised bump, reports that it is soft to touch, painful. Pt reports the area has been present for approx one week, no hx of same.

## 2020-06-13 NOTE — ED Provider Notes (Signed)
[facility]-EMERGENCY DEPT Provider Note   CSN: [id number] Arrival date & time: 06/13/20  1150     History Chief Complaint  Patient presents with  . Abscess    Erin Bowman is a 24 y.o. female with no significant past medical history presents the ED with complaints of abscess.  Patient reports that she has a red bump in her left axillary region that is tender to touch.  She denies any history of prior abscesses.  Patient states that she first noticed that approximately 7 days ago and suspected an ingrown hair.  Since then, she has been using tweezers in the area in an effort to reduce her symptoms of inflammation.  However, she has noticed significant growth of her mass.  She states that it is tender to the touch.  She denies any fevers or chills, numbness or weakness, overlying rash, precipitating injury or trauma, or other symptoms.  Patient is not diabetic or on blood thinners.  HPI     Past Medical History:  Diagnosis Date  . IUD contraception     There are no problems to display for this patient.   Past Surgical History:  Procedure Laterality Date  . INTRAUTERINE DEVICE (IUD) INSERTION    . IUD REMOVAL       OB History   No obstetric history on file.     Family History  Problem Relation Age of Onset  . Stomach cancer Maternal Grandfather   . Colon cancer Neg Hx   . Esophageal cancer Neg Hx   . Rectal cancer Neg Hx     Social History   Tobacco Use  . Smoking status: Never Smoker  . Smokeless tobacco: Never Used  Vaping Use  . Vaping Use: Never used  Substance Use Topics  . Alcohol use: No    Alcohol/week: 0.0 standard drinks  . Drug use: Yes    Types: Marijuana    Comment: uses daily    Home Medications Prior to Admission medications   Medication Sig Start Date End Date Taking? Authorizing Provider  cephALEXin (KEFLEX) 500 MG capsule Take 1 capsule (500 mg total) by mouth 3 (three) times daily for 5 days. 06/13/20 06/18/20   Lorelee New, PA-C  cyclobenzaprine (FLEXERIL) 5 MG tablet Take 1 tablet (5 mg total) by mouth 3 (three) times daily as needed for muscle spasms. 08/15/19   Janne Napoleon, NP  diclofenac sodium (VOLTAREN) 1 % GEL Apply 2 g topically 4 (four) times daily. 10/01/19   Mannie Stabile, PA-C  methocarbamol (ROBAXIN) 500 MG tablet Take 1 tablet (500 mg total) by mouth 2 (two) times daily. 11/13/19   Dartha Lodge, PA-C  metoCLOPramide (REGLAN) 5 MG tablet Take 1 tablet (5 mg total) by mouth every morning. 11/04/18   Sherrilyn Rist, MD  naproxen (NAPROSYN) 500 MG tablet Take 1 tablet (500 mg total) by mouth 2 (two) times daily. 11/13/19   Dartha Lodge, PA-C  ondansetron (ZOFRAN) 8 MG tablet Take 1 tablet (8 mg total) by mouth every 8 (eight) hours as needed for nausea or vomiting. 07/25/18   Sherrilyn Rist, MD    Allergies    Patient has no known allergies.  Review of Systems   Review of Systems  Constitutional: Negative for chills and fever.  Skin: Positive for color change and wound.  Neurological: Negative for weakness and numbness.    Physical Exam Updated Vital Signs BP 131/78 (BP Location: Left Arm)  Pulse 72   Temp 98.5 F (36.9 C) (Oral)   Resp 18   LMP 06/13/2020   SpO2 100%   Physical Exam Vitals and nursing note reviewed. Exam conducted with a chaperone present.  Constitutional:      General: She is not in acute distress.    Appearance: Normal appearance. She is not ill-appearing.  HENT:     Head: Normocephalic and atraumatic.  Eyes:     General: No scleral icterus.    Conjunctiva/sclera: Conjunctivae normal.  Cardiovascular:     Rate and Rhythm: Normal rate and regular rhythm.  Pulmonary:     Effort: Pulmonary effort is normal. No respiratory distress.     Breath sounds: Normal breath sounds.  Musculoskeletal:     Cervical back: Normal range of motion.     Comments: Left axillary region: Large, 3 x 3 cm mass concerning for abscess.  Area of  fluctuance noted.  Mild erythema.  No significant induration. ROM, strength, peripheral pulses, and sensation intact throughout.  Skin:    General: Skin is dry.     Capillary Refill: Capillary refill takes less than 2 seconds.  Neurological:     Mental Status: She is alert.     GCS: GCS eye subscore is 4. GCS verbal subscore is 5. GCS motor subscore is 6.  Psychiatric:        Mood and Affect: Mood normal.        Behavior: Behavior normal.        Thought Content: Thought content normal.   Thing was discharge it was a lot     ED Results / Procedures / Treatments   Labs (all labs ordered are listed, but only abnormal results are displayed) Labs Reviewed - No data to display  EKG None  Radiology No results found.  Procedures .Marland KitchenIncision and Drainage  Date/Time: 06/13/2020 2:37 PM Performed by: Lorelee New, PA-C Authorized by: Lorelee New, PA-C   Consent:    Consent obtained:  Verbal   Consent given by:  Patient   Risks discussed:  Bleeding, incomplete drainage, pain, damage to other organs and infection   Alternatives discussed:  No treatment Universal protocol:    Procedure explained and questions answered to patient or proxy's satisfaction: yes     Relevant documents present and verified: yes     Test results available and properly labeled: yes     Imaging studies available: yes     Required blood products, implants, devices, and special equipment available: yes     Site/side marked: yes     Immediately prior to procedure a time out was called: yes     Patient identity confirmed:  Verbally with patient Location:    Type:  Abscess   Size:  3 cm   Location:  Upper extremity   Upper extremity location:  Arm   Arm location:  L upper arm Pre-procedure details:    Skin preparation:  Betadine Anesthesia (see MAR for exact dosages):    Anesthesia method:  Local infiltration   Local anesthetic:  Lidocaine 1% WITH epi Procedure type:    Complexity:   Simple Procedure details:    Incision types:  Single straight   Incision depth:  Subcutaneous   Scalpel blade:  11   Wound management:  Probed and deloculated, irrigated with saline and extensive cleaning   Drainage:  Purulent   Drainage amount:  Copious   Wound treatment:  Wound left open   Packing materials:  1/4 in  gauze   Amount 1/4":  3 Post-procedure details:    Patient tolerance of procedure:  Tolerated well, no immediate complications   (including critical care time)  Medications Ordered in ED Medications  lidocaine-EPINEPHrine (XYLOCAINE W/EPI) 2 %-1:100000 (with pres) injection 5 mL (has no administration in time range)    ED Course  I have reviewed the triage vital signs and the nursing notes.  Pertinent labs & imaging results that were available during my care of the patient were reviewed by me and considered in my medical decision making (see chart for details).    MDM Rules/Calculators/A&P                          Patient's history and physical exam is consistent with an axillary region abscess.  Will perform incision and drainage here at bedside.    Patient's abscess was amenable to drainage here in the ED.  Performed bedside incision and drainage which yielded copious amount of purulent drainage.  I placed approximately 4 inches of quarter inch iodoform gauze packing.  Encourage patient to continue to keep the area clean and apply warm compresses 4-5 times daily.  Plan is for her to follow-up with outpatient primary care for wound check in 3 to 5 days.  We will prescribe short course of Keflex.  Strict ED return precautions discussed.  Patient and/or family were informed that while patient is appropriate for discharge at this time, some medical emergencies may only develop or become detectable after a period of time.  I specifically instructed patient and/or family to return to return to the ED or seek immediate medical attention for any new or worsening symptoms.   They were provided opportunity to ask any additional questions and have none at this time.  Prior to discharge patient is feeling well, agreeable with plan for discharge home.  They have expressed understanding of verbal discharge instructions as well as return precautions and are agreeable to the plan.    Final Clinical Impression(s) / ED Diagnoses Final diagnoses:  Abscess of left axilla    Rx / DC Orders ED Discharge Orders         Ordered    cephALEXin (KEFLEX) 500 MG capsule  3 times daily     Discontinue  Reprint     06/13/20 1441           Lorelee New, PA-C 06/13/20 1441    Wynetta Fines, MD 06/13/20 1443

## 2020-06-30 ENCOUNTER — Ambulatory Visit: Payer: Managed Care, Other (non HMO) | Admitting: Adult Health

## 2020-06-30 DIAGNOSIS — Z0289 Encounter for other administrative examinations: Secondary | ICD-10-CM

## 2021-04-28 IMAGING — CT CT CERVICAL SPINE W/O CM
3 of 4 series · 11 of 33 positions shown, 13 images · non-contrast
Comparison: October 24, 2018.

CLINICAL DATA: Head injury after fall last night.

EXAM:
CT HEAD WITHOUT CONTRAST
CT MAXILLOFACIAL WITHOUT CONTRAST
CT CERVICAL SPINE WITHOUT CONTRAST
TECHNIQUE: Multidetector CT imaging of the head, cervical spine, and
maxillofacial structures were performed using the standard protocol
without intravenous contrast. Multiplanar CT image reconstructions
of the cervical spine and maxillofacial structures were also
generated.

[Series 6: orthogonal axials · axial · 0.23mm/px · z∈[-357,-235]mm · 3 of 112 slices shown, 4 images]
[im 32/112  soft-tissue]
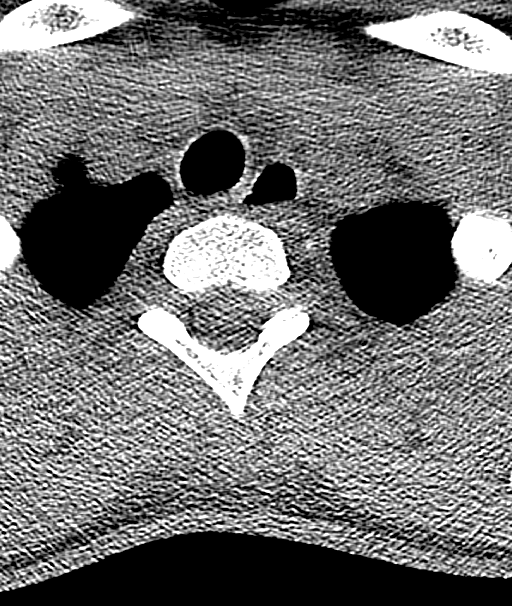
[im 32/112  bone]
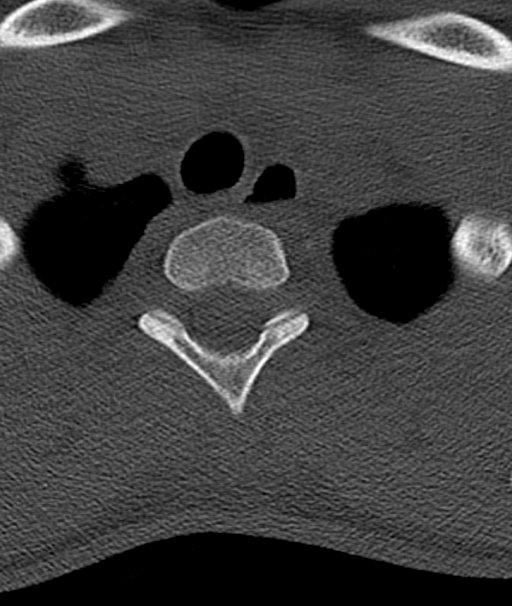
[im 64/112  bone]
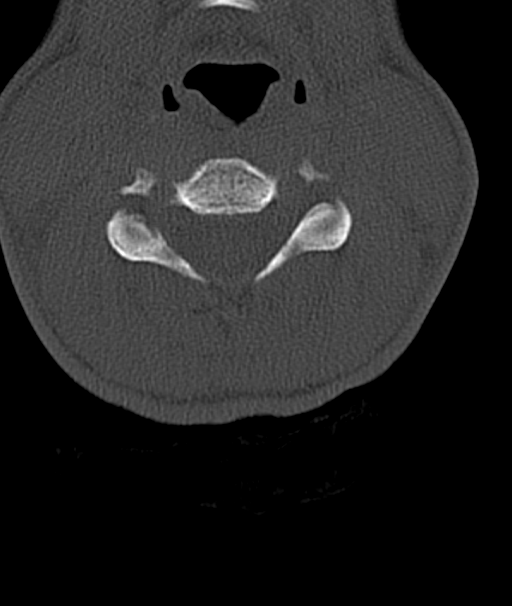
[im 96/112  bone]
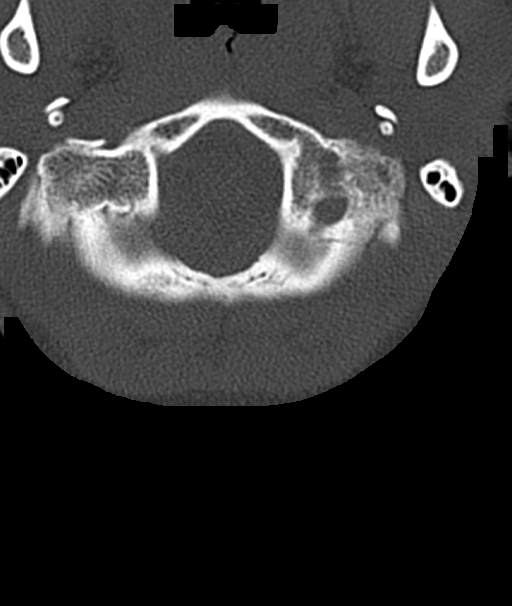

[Series 7: coronal bone · coronal · 0.25mm/px · 3 of 70 slices shown]
[im 14/70  bone]
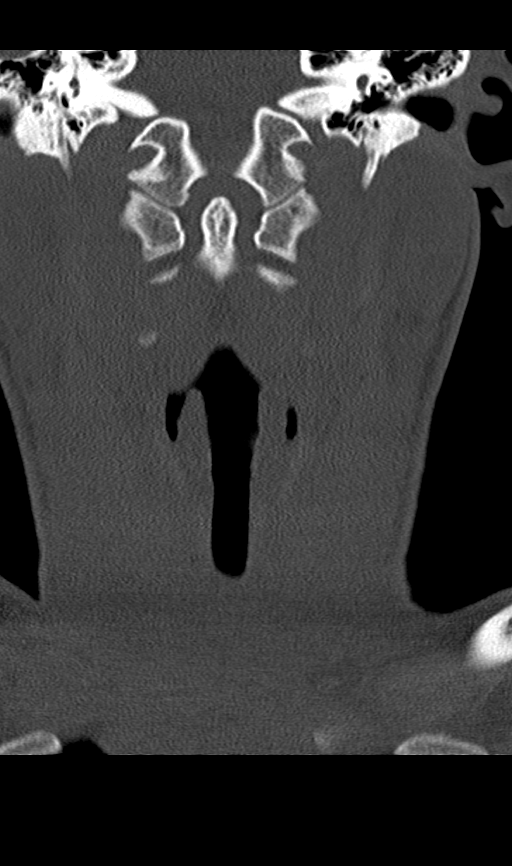
[im 28/70  bone]
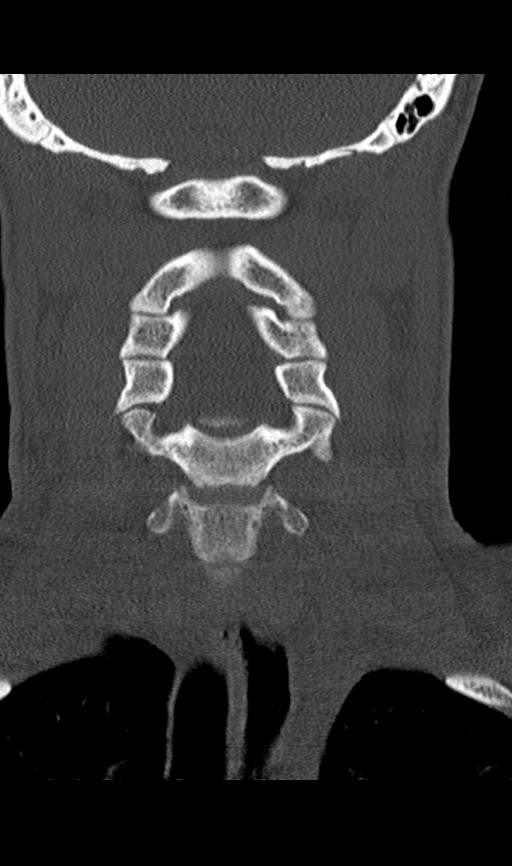
[im 42/70  bone]
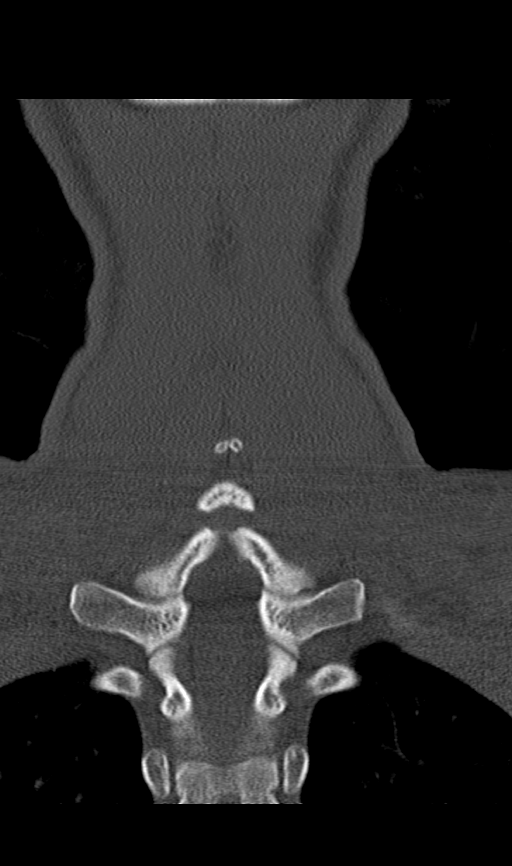

[Series 8: sagittal bone · sagittal · 0.29mm/px · 5 of 61 slices shown, 6 images]
[im 21/61  bone]
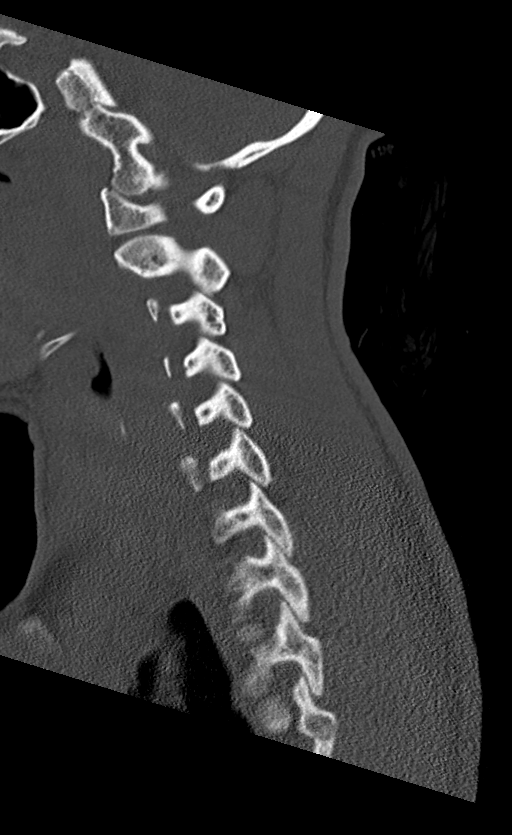
[im 26/61  bone]
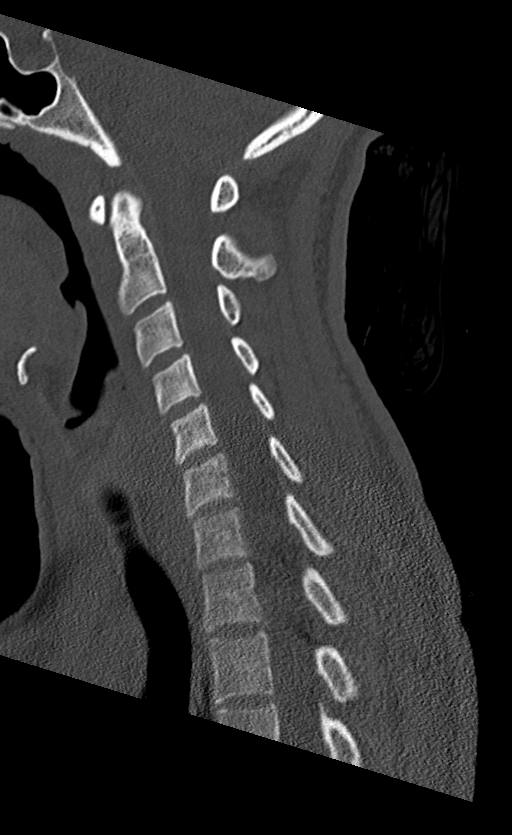
[im 31/61  soft-tissue]
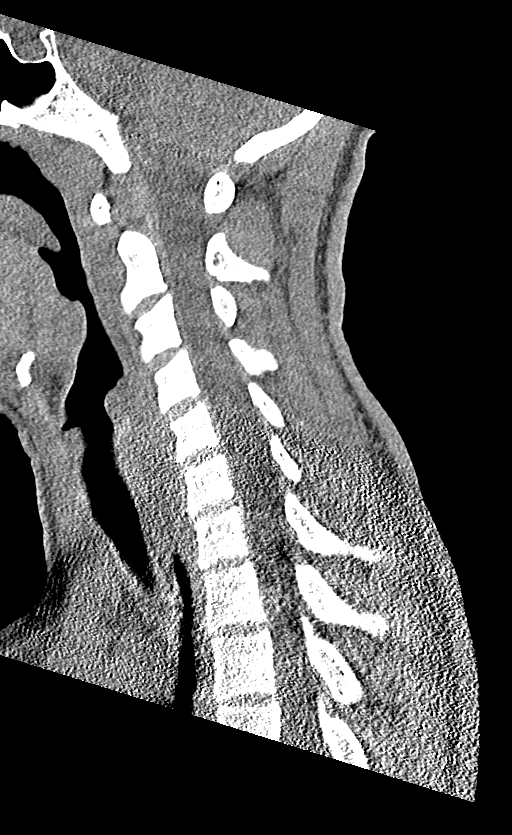
[im 31/61  bone]
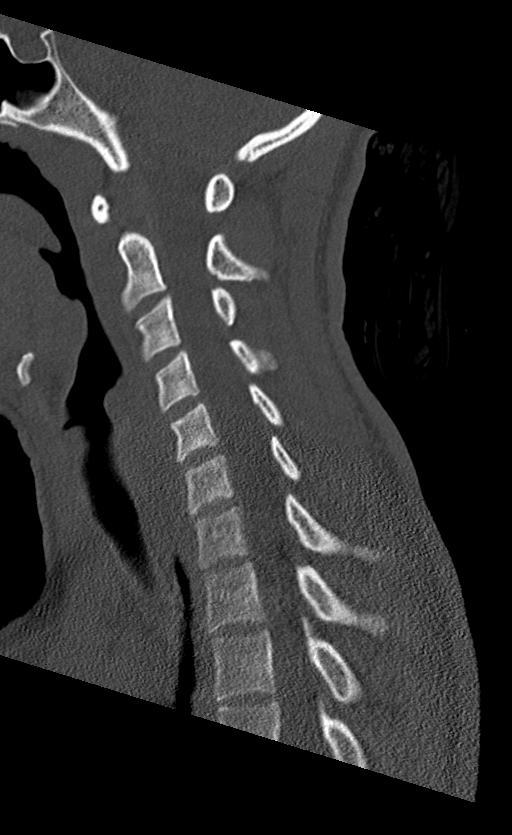
[im 36/61  bone]
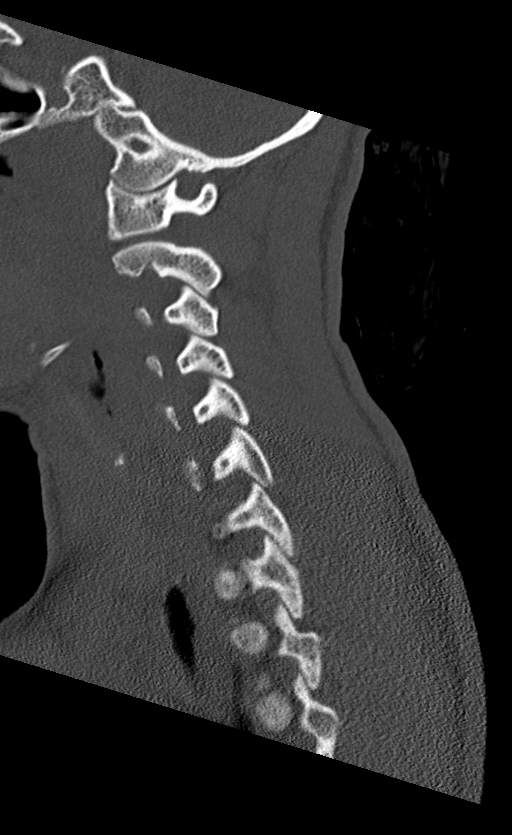
[im 41/61  bone]
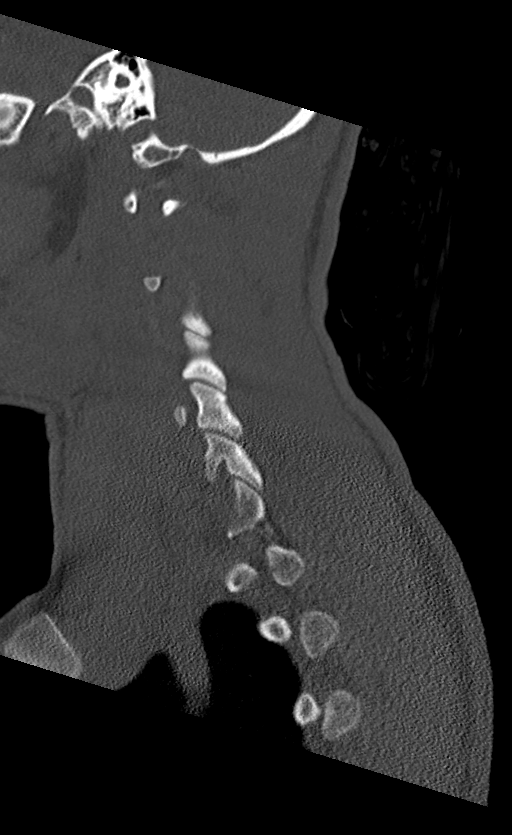

[11 of 33 positions shown; findings below may reference images not displayed]

FINDINGS: CT HEAD FINDINGS

Brain: No evidence of acute infarction, hemorrhage, hydrocephalus,
extra-axial collection or mass lesion/mass effect.

Vascular: No hyperdense vessel or unexpected calcification.

Skull: Normal. Negative for fracture or focal lesion.

Other: None.

CT MAXILLOFACIAL FINDINGS

Osseous: No fracture or mandibular dislocation. No destructive
process.

Orbits: Negative. No traumatic or inflammatory finding.

Sinuses: Clear.

Soft tissues: Negative.

CT CERVICAL SPINE FINDINGS

Alignment: Normal.

Skull base and vertebrae: No acute fracture. No primary bone lesion
or focal pathologic process.

Soft tissues and spinal canal: No prevertebral fluid or swelling. No
visible canal hematoma.

Disc levels:  Normal.

Upper chest: Negative.

Other: None.
IMPRESSION: 1. Normal head CT.
2. No abnormality seen in the maxillofacial region.
3. Normal cervical spine.

## 2022-09-27 ENCOUNTER — Other Ambulatory Visit: Payer: Self-pay

## 2022-09-27 ENCOUNTER — Emergency Department (HOSPITAL_COMMUNITY): Payer: Managed Care, Other (non HMO)

## 2022-09-27 ENCOUNTER — Encounter (HOSPITAL_COMMUNITY): Payer: Self-pay | Admitting: *Deleted

## 2022-09-27 ENCOUNTER — Emergency Department (HOSPITAL_COMMUNITY)
Admission: EM | Admit: 2022-09-27 | Discharge: 2022-09-27 | Disposition: A | Discharge status: Home / Self Care | Payer: Managed Care, Other (non HMO) | Attending: Emergency Medicine | Admitting: Emergency Medicine

## 2022-09-27 DIAGNOSIS — R109 Unspecified abdominal pain: Secondary | ICD-10-CM | POA: Diagnosis not present

## 2022-09-27 DIAGNOSIS — N939 Abnormal uterine and vaginal bleeding, unspecified: Secondary | ICD-10-CM | POA: Diagnosis not present

## 2022-09-27 DIAGNOSIS — O200 Threatened abortion: Secondary | ICD-10-CM | POA: Insufficient documentation

## 2022-09-27 DIAGNOSIS — A599 Trichomoniasis, unspecified: Secondary | ICD-10-CM | POA: Diagnosis not present

## 2022-09-27 LAB — COMPREHENSIVE METABOLIC PANEL
ALT: 12 U/L (ref 0–44)
AST: 17 U/L (ref 15–41)
Albumin: 3.8 g/dL (ref 3.5–5.0)
Alkaline Phosphatase: 39 U/L (ref 38–126)
Anion gap: 5 (ref 5–15)
BUN: 10 mg/dL (ref 6–20)
CO2: 24 mmol/L (ref 22–32)
Calcium: 8.6 mg/dL — ABNORMAL LOW (ref 8.9–10.3)
Chloride: 107 mmol/L (ref 98–111)
Creatinine, Ser: 0.87 mg/dL (ref 0.44–1.00)
GFR, Estimated: >60 mL/min (ref >60)
Glucose, Bld: 97 mg/dL (ref 70–99)
Potassium: 3.8 mmol/L (ref 3.5–5.1)
Sodium: 136 mmol/L (ref 135–145)
Total Bilirubin: 0.8 mg/dL (ref 0.3–1.2)
Total Protein: 7 g/dL (ref 6.5–8.1)

## 2022-09-27 LAB — WET PREP, GENITAL
Sperm: NONE SEEN
WBC, Wet Prep HPF POC: <10 (ref <10)
Yeast Wet Prep HPF POC: NONE SEEN

## 2022-09-27 LAB — CBC
HCT: 36.7 % (ref 36.0–46.0)
Hemoglobin: 12 g/dL (ref 12.0–15.0)
MCH: 32.3 pg (ref 26.0–34.0)
MCHC: 32.7 g/dL (ref 30.0–36.0)
MCV: 98.7 fL (ref 80.0–100.0)
Platelets: 179 10*3/uL (ref 150–400)
RBC: 3.72 MIL/uL — ABNORMAL LOW (ref 3.87–5.11)
RDW: 11.9 % (ref 11.5–15.5)
WBC: 4.9 10*3/uL (ref 4.0–10.5)
nRBC: 0 % (ref 0.0–0.2)

## 2022-09-27 LAB — HCG, QUANTITATIVE, PREGNANCY: hCG, Beta Chain, Quant, S: 3442 m[IU]/mL — ABNORMAL HIGH (ref <5)

## 2022-09-27 NOTE — ED Provider Notes (Signed)
[facility]-EMERGENCY DEPT Provider Note   CSN: [id number] Arrival date & time: 09/27/22  1148     History  Chief Complaint  Patient presents with   Vaginal Bleeding    Erin Bowman is a 26 y.o. female.  The history is provided by the patient and medical records. No language interpreter was used.  Vaginal Bleeding    26 year old female who is approximately 5 weeks and 6 days pregnant presenting complaining of vaginal bleeding.  Patient report her last menstrual period was on 08/17/2022.  She developed trace of bright red blood through her vaginal this morning.  She endorsed minimal abdominal cramping.  She does not endorse any fever chills no nausea vomiting diarrhea no dysuria no hematuria and no vaginal discharge.  No recent trauma.  This is her third pregnancy with 2 prior elective abortion.  She did reach out to her OB/GYN who recommended to come to the ER for further assessment.  Home Medications Prior to Admission medications   Medication Sig Start Date End Date Taking? Authorizing Provider  cyclobenzaprine (FLEXERIL) 5 MG tablet Take 1 tablet (5 mg total) by mouth 3 (three) times daily as needed for muscle spasms. 08/15/19   Janne Napoleon, NP  diclofenac sodium (VOLTAREN) 1 % GEL Apply 2 g topically 4 (four) times daily. 10/01/19   Mannie Stabile, PA-C  methocarbamol (ROBAXIN) 500 MG tablet Take 1 tablet (500 mg total) by mouth 2 (two) times daily. 11/13/19   Dartha Lodge, PA-C  metoCLOPramide (REGLAN) 5 MG tablet Take 1 tablet (5 mg total) by mouth every morning. 11/04/18   Sherrilyn Rist, MD  naproxen (NAPROSYN) 500 MG tablet Take 1 tablet (500 mg total) by mouth 2 (two) times daily. 11/13/19   Dartha Lodge, PA-C  ondansetron (ZOFRAN) 8 MG tablet Take 1 tablet (8 mg total) by mouth every 8 (eight) hours as needed for nausea or vomiting. 07/25/18   Sherrilyn Rist, MD      Allergies    Patient has no known allergies.    Review of Systems    Review of Systems  Genitourinary:  Positive for vaginal bleeding.  All other systems reviewed and are negative.   Physical Exam Updated Vital Signs BP (!) 123/99   Pulse 96   Temp 98.9 F (37.2 C) (Oral)   Resp 18   Ht 5\' 6"  (1.676 m)   Wt 64.7 kg   LMP 08/17/2022   SpO2 99%   BMI 23.01 kg/m  Physical Exam Vitals and nursing note reviewed.  Constitutional:      General: She is not in acute distress.    Appearance: She is well-developed.  HENT:     Head: Atraumatic.  Eyes:     Conjunctiva/sclera: Conjunctivae normal.  Pulmonary:     Effort: Pulmonary effort is normal.  Abdominal:     Palpations: Abdomen is soft.     Tenderness: There is no abdominal tenderness.  Musculoskeletal:     Cervical back: Neck supple.  Skin:    Findings: No rash.  Neurological:     Mental Status: She is alert.  Psychiatric:        Mood and Affect: Mood normal.     ED Results / Procedures / Treatments   Labs (all labs ordered are listed, but only abnormal results are displayed) Labs Reviewed  WET PREP, GENITAL - Abnormal; Notable for the following components:      Result Value   Trich, Wet  Prep PRESENT (*)    Clue Cells Wet Prep HPF POC PRESENT (*)    All other components within normal limits  CBC - Abnormal; Notable for the following components:   RBC 3.72 (*)    All other components within normal limits  COMPREHENSIVE METABOLIC PANEL - Abnormal; Notable for the following components:   Calcium 8.6 (*)    All other components within normal limits  HCG, QUANTITATIVE, PREGNANCY - Abnormal; Notable for the following components:   hCG, Beta Chain, Quant, S 3,442 (*)    All other components within normal limits  URINALYSIS, ROUTINE W REFLEX MICROSCOPIC  RPR  RAPID HIV SCREEN (HIV 1/2 AB+AG)  ABO/RH  GC/CHLAMYDIA PROBE AMP ([place]) NOT AT [facility]    EKG None  Radiology US OB Comp < 14 Wks  Result Date: 09/27/2022 CLINICAL DATA:  Vaginal bleeding EXAM: OBSTETRIC <14 WK  ULTRASOUND TECHNIQUE: Transabdominal ultrasound was performed for evaluation of the gestation as well as the maternal uterus and adnexal regions. COMPARISON:  None Available. FINDINGS: Intrauterine gestational sac: Single Yolk sac:  Not Visualized. Embryo:  Not Visualized. Cardiac Activity: Not Visualized. Heart Rate: Not visualized. MSD:  7.4 mm   5 w   3 d Subchorionic hemorrhage:  None visualized. Maternal uterus/adnexae: Corpus luteum of the right ovary. IMPRESSION: Candidate intrauterine gestational sac at sonographic gestational age of [redacted] weeks, 3 days. No fetal pole or heart rate identified at this time. Early intrauterine pregnancy of uncertain viability. Consider short interval follow-up at 7-14 days to assess for continued development and viability. Electronically Signed   By: Jearld Lesch M.D.   On: 09/27/2022 13:20    Procedures Pelvic exam  Date/Time: 09/27/2022 2:20 PM  Performed by: Fayrene Helper, PA-C Authorized by: Fayrene Helper, PA-C  Consent: Verbal consent obtained. Local anesthesia used: no  Anesthesia: Local anesthesia used: no  Sedation: Patient sedated: no  Comments: Chaperone present during exam.  No inguinal lymphadenopathy or inguinal hernia noted.  Normal external genitalia.  No significant discomfort with speculum insertion.  Mild amount of vaginal bleeding noted in vaginal vault.  Close cervical os free of lesion or rash.  On bimanual exam mild left adnexal tenderness without cervical motion tenderness.         Medications Ordered in ED Medications - No data to display  ED Course/ Medical Decision Making/ A&P                           Medical Decision Making Amount and/or Complexity of Data Reviewed Labs: ordered.   BP (!) 123/99   Pulse 96   Temp 98.9 F (37.2 C) (Oral)   Resp 18   Ht 5\' 6"  (1.676 m)   Wt 64.7 kg   LMP 08/17/2022   SpO2 99%   BMI 23.01 kg/m   2:76 PM 26 year old female who is approximately 5 weeks and 6 days pregnant  presenting complaining of vaginal bleeding.  Patient report her last menstrual period was on 08/17/2022.  She developed trace of bright red blood through her vaginal this morning.  She endorsed minimal abdominal cramping.  She does not endorse any fever chills no nausea vomiting diarrhea no dysuria no hematuria and no vaginal discharge.  No recent trauma.  This is her third pregnancy with 2 prior elective abortion.  She did reach out to her OB/GYN who recommended to come to the ER for further assessment.  On exam this is a well-appearing female sitting  in bed appears to be in no acute discomfort.  Her abdomen is soft and nontender.  Pelvic exam performed by me and with chaperone.  Patient does have mild vaginal bleeding noted in vaginal vault but close cervical os and no significant vaginal laceration significant vaginal discharge.  Mild left adnexal tenderness without cervical motion tenderness.  2:51 PM Labs and imaging obtained independently viewed interpreted by me and I agree with radiologist interpretation.  Ultrasound demonstrates an intrauterine gestational sac approximately 5 weeks and 3 days without evidence of fetal pole or heart rate identified.  This is likely early IUP of uncertain viability and radiologist recommend follow-up in 7 to 14 days for further assessment.  Quantitative for ECG is 3442.  Normal WBC, normal H&H, electrolyte panels are reassuring.  Wet prep positive for trichomonas and clue cells.  Unfortunately nurse notified to me that patient have eloped prior to me discussing today's finding.  She will need to be contacted with appropriate treatment and care instruction.  I have reached out to patient's phone and left a HIPAA compliant voicemail encourage patient to return.  Patient diagnosed with threatened miscarriage as well as trichomonas infection.        Final Clinical Impression(s) / ED Diagnoses Final diagnoses:  Threatened miscarriage  Trichomoniasis    Rx /  DC Orders ED Discharge Orders     None         Domenic Moras, PA-C 09/27/22 1457    Isla Pence, MD 09/28/22 [phone]

## 2022-09-27 NOTE — ED Notes (Signed)
Pt left and stated "I will follow up with my obgyn" pt refused to sign AMA

## 2022-09-27 NOTE — ED Triage Notes (Signed)
Pt states she is 5 weeks 6 days pregnant, spotting yesterday with bleeding today. She states she had a sharp pain then started spotting.

## 2022-09-27 NOTE — ED Provider Triage Note (Signed)
Emergency Medicine Provider Triage Evaluation Note  Concettina Leth , a 26 y.o. female  was evaluated in triage.  Pt complains of vaginal bleeding that started today.  She states she is little over [redacted] weeks pregnant.  Is having some pelvic pressure.  She reached out to her OB who recommended she come to the ED for eval.  Denies lightheadedness.  Review of Systems  Positive: As above Negative: As above  Physical Exam  BP (!) 123/99   Pulse 96   Temp 98.9 F (37.2 C) (Oral)   Resp 18   Ht 5\' 6"  (1.676 m)   Wt 64.7 kg   LMP 08/17/2022   SpO2 99%   BMI 23.01 kg/m  Gen:   Awake, no distress   Resp:  Normal effort  MSK:   Moves extremities without difficulty  Other:    Medical Decision Making  Medically screening exam initiated at 12:17 PM.  Appropriate orders placed.  Jannae Fagerstrom was informed that the remainder of the evaluation will be completed by another provider, this initial triage assessment does not replace that evaluation, and the importance of remaining in the ED until their evaluation is complete.     Evlyn Courier, PA-C 09/27/22 1219

## 2022-09-27 NOTE — ED Notes (Signed)
Pt at nurses station saying "can you tell me if the baby is dead or not, I just want to know so I can leave, refusing to go back to room" PA aware

## 2022-09-28 LAB — GC/CHLAMYDIA PROBE AMP (~~LOC~~) NOT AT ARMC
Chlamydia: NEGATIVE
Comment: NEGATIVE
Comment: NORMAL
Neisseria Gonorrhea: NEGATIVE

## 2023-04-10 ENCOUNTER — Emergency Department (HOSPITAL_COMMUNITY): Payer: Managed Care, Other (non HMO)

## 2023-04-10 ENCOUNTER — Observation Stay (HOSPITAL_COMMUNITY)
Admission: EM | Admit: 2023-04-10 | Discharge: 2023-04-12 | Disposition: A | Discharge status: Home / Self Care | Payer: Managed Care, Other (non HMO) | Attending: Surgery | Admitting: Surgery

## 2023-04-10 ENCOUNTER — Other Ambulatory Visit: Payer: Self-pay

## 2023-04-10 ENCOUNTER — Encounter (HOSPITAL_COMMUNITY): Payer: Self-pay

## 2023-04-10 DIAGNOSIS — R1031 Right lower quadrant pain: Secondary | ICD-10-CM | POA: Diagnosis present

## 2023-04-10 DIAGNOSIS — K3580 Unspecified acute appendicitis: Secondary | ICD-10-CM | POA: Diagnosis not present

## 2023-04-10 DIAGNOSIS — Z79899 Other long term (current) drug therapy: Secondary | ICD-10-CM | POA: Insufficient documentation

## 2023-04-10 DIAGNOSIS — R109 Unspecified abdominal pain: Secondary | ICD-10-CM | POA: Diagnosis present

## 2023-04-10 LAB — WET PREP, GENITAL
Clue Cells Wet Prep HPF POC: NONE SEEN
Sperm: NONE SEEN
WBC, Wet Prep HPF POC: >=10 — AB (ref <10)
Yeast Wet Prep HPF POC: NONE SEEN

## 2023-04-10 LAB — URINALYSIS, ROUTINE W REFLEX MICROSCOPIC
Bacteria, UA: NONE SEEN
Bilirubin Urine: NEGATIVE
Glucose, UA: NEGATIVE mg/dL
Ketones, ur: 20 mg/dL — AB
Nitrite: NEGATIVE
Protein, ur: NEGATIVE mg/dL
Specific Gravity, Urine: <=1.005 (ref 1.005–1.030)
pH: 6 (ref 5.0–8.0)

## 2023-04-10 LAB — I-STAT BETA HCG BLOOD, ED (MC, WL, AP ONLY): I-stat hCG, quantitative: 22 m[IU]/mL — ABNORMAL HIGH (ref <5)

## 2023-04-10 LAB — CBC
HCT: 37.5 % (ref 36.0–46.0)
Hemoglobin: 12.4 g/dL (ref 12.0–15.0)
MCH: 32.5 pg (ref 26.0–34.0)
MCHC: 33.1 g/dL (ref 30.0–36.0)
MCV: 98.2 fL (ref 80.0–100.0)
Platelets: 182 10*3/uL (ref 150–400)
RBC: 3.82 MIL/uL — ABNORMAL LOW (ref 3.87–5.11)
RDW: 12.6 % (ref 11.5–15.5)
WBC: 14.6 10*3/uL — ABNORMAL HIGH (ref 4.0–10.5)
nRBC: 0 % (ref 0.0–0.2)

## 2023-04-10 LAB — DIFFERENTIAL
Abs Immature Granulocytes: 0.05 10*3/uL (ref 0.00–0.07)
Basophils Absolute: 0 10*3/uL (ref 0.0–0.1)
Basophils Relative: 0 %
Eosinophils Absolute: 0 10*3/uL (ref 0.0–0.5)
Eosinophils Relative: 0 %
Immature Granulocytes: 0 %
Lymphocytes Relative: 11 %
Lymphs Abs: 1.5 10*3/uL (ref 0.7–4.0)
Monocytes Absolute: 0.5 10*3/uL (ref 0.1–1.0)
Monocytes Relative: 3 %
Neutro Abs: 12.6 10*3/uL — ABNORMAL HIGH (ref 1.7–7.7)
Neutrophils Relative %: 86 %

## 2023-04-10 LAB — COMPREHENSIVE METABOLIC PANEL
ALT: 10 U/L (ref 0–44)
AST: 12 U/L — ABNORMAL LOW (ref 15–41)
Albumin: 3.8 g/dL (ref 3.5–5.0)
Alkaline Phosphatase: 45 U/L (ref 38–126)
Anion gap: 9 (ref 5–15)
BUN: 7 mg/dL (ref 6–20)
CO2: 23 mmol/L (ref 22–32)
Calcium: 8.8 mg/dL — ABNORMAL LOW (ref 8.9–10.3)
Chloride: 104 mmol/L (ref 98–111)
Creatinine, Ser: 0.8 mg/dL (ref 0.44–1.00)
GFR, Estimated: >60 mL/min (ref >60)
Glucose, Bld: 87 mg/dL (ref 70–99)
Potassium: 3 mmol/L — ABNORMAL LOW (ref 3.5–5.1)
Sodium: 136 mmol/L (ref 135–145)
Total Bilirubin: 0.7 mg/dL (ref 0.3–1.2)
Total Protein: 7.3 g/dL (ref 6.5–8.1)

## 2023-04-10 LAB — LIPASE, BLOOD: Lipase: 21 U/L (ref 11–51)

## 2023-04-10 MED ORDER — SODIUM CHLORIDE 0.9 % IV BOLUS
1000.0000 mL | Freq: Once | INTRAVENOUS | Status: AC
  Administered 2023-04-10: 1000 mL via INTRAVENOUS

## 2023-04-10 MED ORDER — MORPHINE SULFATE (PF) 4 MG/ML IV SOLN
4.0000 mg | Freq: Once | INTRAVENOUS | Status: AC
  Administered 2023-04-10: 4 mg via INTRAVENOUS
  Filled 2023-04-10: qty 1

## 2023-04-10 MED ORDER — IOHEXOL 300 MG/ML  SOLN
100.0000 mL | Freq: Once | INTRAMUSCULAR | Status: AC | PRN
  Administered 2023-04-10: 100 mL via INTRAVENOUS

## 2023-04-10 MED ORDER — KCL IN DEXTROSE-NACL 20-5-0.9 MEQ/L-%-% IV SOLN
INTRAVENOUS | Status: DC
  Filled 2023-04-10 (×3): qty 1000

## 2023-04-10 MED ORDER — PIPERACILLIN-TAZOBACTAM 3.375 G IVPB 30 MIN
3.3750 g | Freq: Once | INTRAVENOUS | Status: AC
  Administered 2023-04-10: 3.375 g via INTRAVENOUS
  Filled 2023-04-10: qty 50

## 2023-04-10 NOTE — ED Triage Notes (Signed)
Seen at [facility] yesterday for back pain and lower abdominal pain. Pt states she has an [place] scheduled today at 1445, but pain was worsening so she came here. Negative pregnancy test yesterday.  C/o nausea this AM with 1 episode of vomiting and decreased appetite.

## 2023-04-10 NOTE — H&P (Signed)
Erin Bowman is an 27 y.o. female.   Chief Complaint: Abdominal pain HPI: 27 year old female with a 3-day history of lower abdominal pain.  She states she has had abdominal pain off and on for a total of a year.  She is 6 weeks status post D&C for a 15-week pregnancy.  She has had some vaginal bleeding really since then.  She is also having a menstrual period about 2 weeks ago.  She was seen in [place] yesterday and was sent for further workup.  Her lower abdominal pain worsened and she sought care in the emergency room.  She complains of lower abdominal pain.  It slightly worse on the right than the left.  But states her entire abdomen is sore.  She is lying in bed in the fetal position.  No nausea or vomiting.  She has had this pain before but its gotten much worse this time.  CT scan showed a mildly thickened appendix.  There are changes of previous D&C.  She had an ultrasound of her pelvis which showed no ovarian torsion.  There is no evidence of any retained products in the uterus nor an intrauterine pregnancy.  Her beta hCG was mildly elevated.  Her potassium was low.  Denies any blood in her stool.  No dysuria.  Denies any recent sexual activity.  Past Medical History:  Diagnosis Date   IUD contraception     Past Surgical History:  Procedure Laterality Date   INTRAUTERINE DEVICE (IUD) INSERTION     IUD REMOVAL      Family History  Problem Relation Age of Onset   Stomach cancer Maternal Grandfather    Colon cancer Neg Hx    Esophageal cancer Neg Hx    Rectal cancer Neg Hx    Social History:  reports that she has never smoked. She has never used smokeless tobacco. She reports that she does not currently use drugs after having used the following drugs: Marijuana. She reports that she does not drink alcohol.  Allergies: No Known Allergies  (Not in a hospital admission)   Results for orders placed or performed during the hospital encounter of 04/10/23 (from the past 48 hour(s))   I-Stat beta hCG blood, ED     Status: Abnormal   Collection Time: 04/10/23  2:28 PM  Result Value Ref Range   I-stat hCG, quantitative 22.0 (H) <5 mIU/mL   Comment 3            Comment:   GEST. AGE      CONC.  (mIU/mL)   <=1 WEEK        5 - 50     2 WEEKS       50 - 500     3 WEEKS       100 - 10,000     4 WEEKS     1,000 - 30,000        FEMALE AND NON-PREGNANT FEMALE:     LESS THAN 5 mIU/mL   Lipase, blood     Status: None   Collection Time: 04/10/23  2:30 PM  Result Value Ref Range   Lipase 21 11 - 51 U/L    Comment: Performed at [facility], [address] [place], [place] [address]  Comprehensive metabolic panel     Status: Abnormal   Collection Time: 04/10/23  2:30 PM  Result Value Ref Range   Sodium 136 135 - 145 mmol/L   Potassium 3.0 (L) 3.5 - 5.1  mmol/L   Chloride 104 98 - 111 mmol/L   CO2 23 22 - 32 mmol/L   Glucose, Bld 87 70 - 99 mg/dL    Comment: Glucose reference range applies only to samples taken after fasting for at least 8 hours.   BUN 7 6 - 20 mg/dL   Creatinine, Ser 1.61 0.44 - 1.00 mg/dL   Calcium 8.8 (L) 8.9 - 10.3 mg/dL   Total Protein 7.3 6.5 - 8.1 g/dL   Albumin 3.8 3.5 - 5.0 g/dL   AST 12 (L) 15 - 41 U/L   ALT 10 0 - 44 U/L   Alkaline Phosphatase 45 38 - 126 U/L   Total Bilirubin 0.7 0.3 - 1.2 mg/dL   GFR, Estimated >09 >60 mL/min    Comment: (NOTE) Calculated using the CKD-EPI Creatinine Equation (2021)    Anion gap 9 5 - 15    Comment: Performed at [facility], [address], [place], [place] [address]  CBC     Status: Abnormal   Collection Time: 04/10/23  2:30 PM  Result Value Ref Range   WBC 14.6 (H) 4.0 - 10.5 K/uL   RBC 3.82 (L) 3.87 - 5.11 MIL/uL   Hemoglobin 12.4 12.0 - 15.0 g/dL   HCT 81.1 91.4 - 78.2 %   MCV 98.2 80.0 - 100.0 fL   MCH 32.5 26.0 - 34.0 pg   MCHC 33.1 30.0 - 36.0 g/dL   RDW 95.6 21.3 - 08.6 %   Platelets 182 150 - 400 K/uL   nRBC 0.0 0.0 - 0.2 %    Comment: Performed at  [facility], [address] [place], [place] [address]  Differential     Status: Abnormal   Collection Time: 04/10/23  2:30 PM  Result Value Ref Range   Neutrophils Relative % 86 %   Neutro Abs 12.6 (H) 1.7 - 7.7 K/uL   Lymphocytes Relative 11 %   Lymphs Abs 1.5 0.7 - 4.0 K/uL   Monocytes Relative 3 %   Monocytes Absolute 0.5 0.1 - 1.0 K/uL   Eosinophils Relative 0 %   Eosinophils Absolute 0.0 0.0 - 0.5 K/uL   Basophils Relative 0 %   Basophils Absolute 0.0 0.0 - 0.1 K/uL   Immature Granulocytes 0 %   Abs Immature Granulocytes 0.05 0.00 - 0.07 K/uL    Comment: Performed at [facility], [address], [place], [place] [address]  Wet prep, genital     Status: Abnormal   Collection Time: 04/10/23  6:29 PM   Specimen: PATH Cytology Cervicovaginal Ancillary Only  Result Value Ref Range   Yeast Wet Prep HPF POC NONE SEEN NONE SEEN   Trich, Wet Prep PRESENT (A) NONE SEEN   Clue Cells Wet Prep HPF POC NONE SEEN NONE SEEN   WBC, Wet Prep HPF POC >=10 (A) <10   Sperm NONE SEEN     Comment: Performed at [facility], [address] [place], [place] [address]   CT ABDOMEN PELVIS W CONTRAST  Result Date: 04/10/2023 CLINICAL DATA:  RLQ abdominal pain EXAM: CT ABDOMEN AND PELVIS WITH CONTRAST TECHNIQUE: Multidetector CT imaging of the abdomen and pelvis was performed using the standard protocol following bolus administration of intravenous contrast. RADIATION DOSE REDUCTION: This exam was performed according to the departmental dose-optimization program which includes automated exposure control, adjustment of the mA and/or kV according to patient size and/or use of iterative reconstruction technique. CONTRAST:  OMNIPAQUE IOHEXOL  300 MG/ML  SOLN COMPARISON:  Pelvic ultrasound earlier today, abdominopelvic CT 07/28/2018 FINDINGS: Lower chest: Clear lung bases. Hepatobiliary: Focal fatty infiltration adjacent to the falciform ligament. No  focal liver lesion. Gallbladder physiologically distended, no calcified stone. No biliary dilatation. Pancreas: Unremarkable. No pancreatic ductal dilatation or surrounding inflammatory changes. Spleen: Normal in size without focal abnormality. Adrenals/Urinary Tract: Normal adrenal glands. No hydronephrosis or perinephric edema. Homogeneous renal enhancement. No renal stone or focal renal abnormality. Urinary bladder is minimally distended, normal for degree of distension. Stomach/Bowel: The appendix is upper normal at 7 mm. Minimal intraluminal fluid but no periappendiceal fat stranding or inflammation. No hyperemia. No appendicolith. There is air within the distal appendix. No bowel obstruction or inflammation. Small volume of formed stool in the colon. Vascular/Lymphatic: No acute vascular findings. Normal caliber abdominal aorta. Patent portal vein. No enlarged lymph nodes in the abdomen or pelvis. Reproductive: Assessed on pelvic ultrasound earlier today. Retroverted uterus. Physiologic left ovary corpus luteal cyst. Other: No free air or free fluid. Musculoskeletal: There are no acute or suspicious osseous abnormalities. IMPRESSION: 1. Upper normal appendix at 7 mm with minimal intraluminal fluid but no periappendiceal fat stranding or inflammation. Findings are equivocal for early acute appendicitis in the appropriate clinical setting. 2. No other acute abnormality in the abdomen/pelvis. Electronically Signed   By: Narda Rutherford M.D.   On: 04/10/2023 18:10   US Pelvis Complete  Result Date: 04/10/2023 CLINICAL DATA:  Pelvic pain EXAM: TRANSABDOMINAL AND TRANSVAGINAL ULTRASOUND OF PELVIS DOPPLER ULTRASOUND OF OVARIES TECHNIQUE: Both transabdominal and transvaginal ultrasound examinations of the pelvis were performed. Transabdominal technique was performed for global imaging of the pelvis including uterus, ovaries, adnexal regions, and pelvic cul-de-sac. It was necessary to proceed with endovaginal exam  following the transabdominal exam to visualize the uterus and ovaries. Color and duplex Doppler ultrasound was utilized to evaluate blood flow to the ovaries. COMPARISON:  None Available. FINDINGS: Uterus Measurements: 7.4 x 4.4 x 5.3 cm = volume: 93.1 mL. Retroverted. No fibroids or other mass visualized. Endometrium Thickness: 8 mm.  Heterogeneous with mobile debris. Right ovary Measurements: 4.0 x 2.7 x 1.9 cm = volume: 10.6 mL. Normal appearance/no adnexal mass. Left ovary Measurements: 3.8 x 2.6 x 2.8 cm = volume: 14.0 mL. Normal appearance/no adnexal mass. Corpus luteum cyst measuring up to 2.2 cm. Pulsed Doppler evaluation of both ovaries demonstrates normal low-resistance arterial and venous waveforms. Other findings No abnormal free fluid. IMPRESSION: 1. No evidence of ovarian torsion. 2. Heterogeneous endometrium with mobile debris, likely blood products. Electronically Signed   By: Allegra Lai M.D.   On: 04/10/2023 16:01   US Transvaginal Non-OB  Result Date: 04/10/2023 CLINICAL DATA:  Pelvic pain EXAM: TRANSABDOMINAL AND TRANSVAGINAL ULTRASOUND OF PELVIS DOPPLER ULTRASOUND OF OVARIES TECHNIQUE: Both transabdominal and transvaginal ultrasound examinations of the pelvis were performed. Transabdominal technique was performed for global imaging of the pelvis including uterus, ovaries, adnexal regions, and pelvic cul-de-sac. It was necessary to proceed with endovaginal exam following the transabdominal exam to visualize the uterus and ovaries. Color and duplex Doppler ultrasound was utilized to evaluate blood flow to the ovaries. COMPARISON:  None Available. FINDINGS: Uterus Measurements: 7.4 x 4.4 x 5.3 cm = volume: 93.1 mL. Retroverted. No fibroids or other mass visualized. Endometrium Thickness: 8 mm.  Heterogeneous with mobile debris. Right ovary Measurements: 4.0 x 2.7 x 1.9 cm = volume: 10.6 mL. Normal appearance/no adnexal mass. Left ovary Measurements: 3.8 x 2.6 x 2.8 cm = volume: 14.0 mL.  Normal appearance/no adnexal mass. Corpus luteum cyst measuring up to 2.2 cm. Pulsed Doppler evaluation of both ovaries demonstrates normal low-resistance arterial and venous waveforms. Other findings No abnormal free fluid. IMPRESSION: 1. No evidence of ovarian torsion. 2. Heterogeneous endometrium with mobile debris, likely blood products. Electronically Signed   By: Allegra Lai M.D.   On: 04/10/2023 16:01   [place] Art/Ven Flow Abd Pelv Doppler  Result Date: 04/10/2023 CLINICAL DATA:  Pelvic pain EXAM: TRANSABDOMINAL AND TRANSVAGINAL ULTRASOUND OF PELVIS DOPPLER ULTRASOUND OF OVARIES TECHNIQUE: Both transabdominal and transvaginal ultrasound examinations of the pelvis were performed. Transabdominal technique was performed for global imaging of the pelvis including uterus, ovaries, adnexal regions, and pelvic cul-de-sac. It was necessary to proceed with endovaginal exam following the transabdominal exam to visualize the uterus and ovaries. Color and duplex Doppler ultrasound was utilized to evaluate blood flow to the ovaries. COMPARISON:  None Available. FINDINGS: Uterus Measurements: 7.4 x 4.4 x 5.3 cm = volume: 93.1 mL. Retroverted. No fibroids or other mass visualized. Endometrium Thickness: 8 mm.  Heterogeneous with mobile debris. Right ovary Measurements: 4.0 x 2.7 x 1.9 cm = volume: 10.6 mL. Normal appearance/no adnexal mass. Left ovary Measurements: 3.8 x 2.6 x 2.8 cm = volume: 14.0 mL. Normal appearance/no adnexal mass. Corpus luteum cyst measuring up to 2.2 cm. Pulsed Doppler evaluation of both ovaries demonstrates normal low-resistance arterial and venous waveforms. Other findings No abnormal free fluid. IMPRESSION: 1. No evidence of ovarian torsion. 2. Heterogeneous endometrium with mobile debris, likely blood products. Electronically Signed   By: Allegra Lai M.D.   On: 04/10/2023 16:01    Review of Systems  Gastrointestinal:  Positive for abdominal pain. Negative for nausea and vomiting.   Genitourinary:  Positive for pelvic pain and vaginal bleeding. Negative for flank pain, frequency and hematuria.  Musculoskeletal:  Positive for back pain.  All other systems reviewed and are negative.   Blood pressure 120/64, pulse 99, temperature 98.1 F (36.7 C), temperature source Oral, resp. rate 17, height 5\' 6"  (1.676 m), weight 68 kg, last menstrual period 08/17/2022, SpO2 100 %. Physical Exam Constitutional:      Appearance: She is well-developed.  HENT:     Head: Normocephalic.  Eyes:     Extraocular Movements: Extraocular movements intact.  Cardiovascular:     Rate and Rhythm: Tachycardia present.  Pulmonary:     Effort: Pulmonary effort is normal.     Breath sounds: No stridor.  Abdominal:     Palpations: Abdomen is soft.     Tenderness: There is generalized abdominal tenderness.     Hernia: No hernia is present.  Skin:    General: Skin is warm.  Neurological:     General: No focal deficit present.     Mental Status: She is alert.  Psychiatric:        Mood and Affect: Mood normal.      Assessment/Plan Abdominal pain  Reviewed the CT scan myself.  There is mild thickening of the appendix which is equivocal.  There is no obvious free fluid or free air.  The uterus still has changes consistent with D&C.  She has had pain off and on for a number of months.  Not sure if this is related to her D&C or if she has signs of appendicitis that are equivocal by CT.  Her examination shows diffuse abdominal tenderness but her abdomen is soft and not rigid.  Recommend admission for IV antibiotics.  I think she will require laparoscopy to  sort out her abdominal pain issues while she is here.  She may require appendectomy at the same time but I do not see an emergent need to take her to surgery tonight.  Allow clear liquids for now n.p.o. after midnight.  Dr. Freida Busman to assess in a.m. to determine if surgery is going to be necessary or if she improves on antibiotics.  Dortha Schwalbe, MD 04/10/2023, 8:57 PM  Moderate complexity

## 2023-04-10 NOTE — ED Provider Notes (Signed)
[facility] AT [facility] Provider Note   CSN: [id number] Arrival date & time: 04/10/23  1334     History  Chief Complaint  Patient presents with   Abdominal Pain   Back Pain    Erin Bowman is a 27 y.o. female.  With a history of elective abortion in March via D&C who presents to the ED for evaluation of periumbilical and bilateral lower quadrant abdominal pain.  She also reports intermittent vaginal bleeding that has been present since her menstrual cycle began approximately 2 weeks ago.  Her lower abdominal pain began 3 days ago and has progressively gotten worse.  She presented to urgent care yesterday and an ultrasound ordered for today but states that her symptoms have continued and she would like to be reassessed.  She denies other vaginal discharge, vaginal pain, odor, urinary symptoms, fevers, chills.  She has had elective abortions in the past but states that she has never had complications before.  She does report significant alcohol use with her last use 3 days ago.  She reports daily marijuana use as well.  She denies other recreational drug use.  She used to vape but has not vaped in nearly 5 months.  Last food intake was 2 days ago and last fluid intake was at approximately 10 AM.   Abdominal Pain Associated symptoms: vaginal bleeding   Back Pain Associated symptoms: abdominal pain        Home Medications Prior to Admission medications   Medication Sig Start Date End Date Taking? Authorizing Provider  acetaminophen (TYLENOL) 500 MG tablet Take 1,000 mg by mouth every 8 (eight) hours as needed for fever or moderate pain.   Yes [provider]  ibuprofen (ADVIL) 200 MG tablet Take 800 mg by mouth every 8 (eight) hours as needed for mild pain or moderate pain.   Yes [provider]      Allergies    Patient has no known allergies.    Review of Systems   Review of Systems  Gastrointestinal:  Positive for abdominal  pain.  Genitourinary:  Positive for vaginal bleeding.  Musculoskeletal:  Positive for back pain.  All other systems reviewed and are negative.   Physical Exam Updated Vital Signs BP 120/64 (BP Location: Right Arm)   Pulse 99   Temp 98.1 F (36.7 C) (Oral)   Resp 17   Ht 5\' 6"  (1.676 m)   Wt 68 kg   LMP 08/17/2022   SpO2 100%   BMI 24.21 kg/m  Physical Exam Vitals and nursing note reviewed. Exam conducted with a chaperone present Shanda Bumps, RN).  Constitutional:      General: She is not in acute distress.    Appearance: She is well-developed. She is not ill-appearing, toxic-appearing or diaphoretic.     Comments: Resting comfortably in bed  HENT:     Head: Normocephalic and atraumatic.  Eyes:     Conjunctiva/sclera: Conjunctivae normal.  Cardiovascular:     Rate and Rhythm: Normal rate and regular rhythm.     Heart sounds: No murmur heard. Pulmonary:     Effort: Pulmonary effort is normal. No respiratory distress.     Breath sounds: Normal breath sounds. No wheezing, rhonchi or rales.  Abdominal:     General: Abdomen is flat.     Palpations: Abdomen is soft.     Tenderness: There is generalized abdominal tenderness and tenderness in the right lower quadrant and left lower quadrant. There is guarding. Positive signs  include McBurney's sign, psoas sign and obturator sign.     Comments: Positive heeltap test  Genitourinary:    Exam position: Lithotomy position.     Labia:        Right: No rash.        Left: No rash.      Comments: Small amount of dark-colored blood in the posterior cul-de-sac.  No active cervical bleeding.  Os does appear to be opened.  No other vaginal or cervical discharge.  No cervical motion tenderness.  Right adnexal and uterine tenderness to palpation without fullness or masses.  No left adnexal tenderness. Musculoskeletal:        General: No swelling.     Cervical back: Neck supple.  Skin:    General: Skin is warm and dry.     Capillary Refill:  Capillary refill takes less than 2 seconds.  Neurological:     Mental Status: She is alert.  Psychiatric:        Mood and Affect: Mood normal.     ED Results / Procedures / Treatments   Labs (all labs ordered are listed, but only abnormal results are displayed) Labs Reviewed  WET PREP, GENITAL - Abnormal; Notable for the following components:      Result Value   Trich, Wet Prep PRESENT (*)    WBC, Wet Prep HPF POC >=10 (*)    All other components within normal limits  COMPREHENSIVE METABOLIC PANEL - Abnormal; Notable for the following components:   Potassium 3.0 (*)    Calcium 8.8 (*)    AST 12 (*)    All other components within normal limits  CBC - Abnormal; Notable for the following components:   WBC 14.6 (*)    RBC 3.82 (*)    All other components within normal limits  DIFFERENTIAL - Abnormal; Notable for the following components:   Neutro Abs 12.6 (*)    All other components within normal limits  I-STAT BETA HCG BLOOD, ED ([facility], [facility], [facility] ONLY) - Abnormal; Notable for the following components:   I-stat hCG, quantitative 22.0 (*)    All other components within normal limits  LIPASE, BLOOD  RPR  URINALYSIS, ROUTINE W REFLEX MICROSCOPIC  HIV ANTIBODY (ROUTINE TESTING W REFLEX)  HCG, QUANTITATIVE, PREGNANCY  GC/CHLAMYDIA PROBE AMP ([place]) NOT AT [facility]    EKG None  Radiology CT ABDOMEN PELVIS W CONTRAST  Result Date: 04/10/2023 CLINICAL DATA:  RLQ abdominal pain EXAM: CT ABDOMEN AND PELVIS WITH CONTRAST TECHNIQUE: Multidetector CT imaging of the abdomen and pelvis was performed using the standard protocol following bolus administration of intravenous contrast. RADIATION DOSE REDUCTION: This exam was performed according to the departmental dose-optimization program which includes automated exposure control, adjustment of the mA and/or kV according to patient size and/or use of iterative reconstruction technique. CONTRAST:  OMNIPAQUE IOHEXOL 300 MG/ML  SOLN COMPARISON:   Pelvic ultrasound earlier today, abdominopelvic CT 07/28/2018 FINDINGS: Lower chest: Clear lung bases. Hepatobiliary: Focal fatty infiltration adjacent to the falciform ligament. No focal liver lesion. Gallbladder physiologically distended, no calcified stone. No biliary dilatation. Pancreas: Unremarkable. No pancreatic ductal dilatation or surrounding inflammatory changes. Spleen: Normal in size without focal abnormality. Adrenals/Urinary Tract: Normal adrenal glands. No hydronephrosis or perinephric edema. Homogeneous renal enhancement. No renal stone or focal renal abnormality. Urinary bladder is minimally distended, normal for degree of distension. Stomach/Bowel: The appendix is upper normal at 7 mm. Minimal intraluminal fluid but no periappendiceal fat stranding or inflammation. No hyperemia. No appendicolith.  There is air within the distal appendix. No bowel obstruction or inflammation. Small volume of formed stool in the colon. Vascular/Lymphatic: No acute vascular findings. Normal caliber abdominal aorta. Patent portal vein. No enlarged lymph nodes in the abdomen or pelvis. Reproductive: Assessed on pelvic ultrasound earlier today. Retroverted uterus. Physiologic left ovary corpus luteal cyst. Other: No free air or free fluid. Musculoskeletal: There are no acute or suspicious osseous abnormalities. IMPRESSION: 1. Upper normal appendix at 7 mm with minimal intraluminal fluid but no periappendiceal fat stranding or inflammation. Findings are equivocal for early acute appendicitis in the appropriate clinical setting. 2. No other acute abnormality in the abdomen/pelvis. Electronically Signed   By: Narda Rutherford M.D.   On: 04/10/2023 18:10   US Pelvis Complete  Result Date: 04/10/2023 CLINICAL DATA:  Pelvic pain EXAM: TRANSABDOMINAL AND TRANSVAGINAL ULTRASOUND OF PELVIS DOPPLER ULTRASOUND OF OVARIES TECHNIQUE: Both transabdominal and transvaginal ultrasound examinations of the pelvis were performed.  Transabdominal technique was performed for global imaging of the pelvis including uterus, ovaries, adnexal regions, and pelvic cul-de-sac. It was necessary to proceed with endovaginal exam following the transabdominal exam to visualize the uterus and ovaries. Color and duplex Doppler ultrasound was utilized to evaluate blood flow to the ovaries. COMPARISON:  None Available. FINDINGS: Uterus Measurements: 7.4 x 4.4 x 5.3 cm = volume: 93.1 mL. Retroverted. No fibroids or other mass visualized. Endometrium Thickness: 8 mm.  Heterogeneous with mobile debris. Right ovary Measurements: 4.0 x 2.7 x 1.9 cm = volume: 10.6 mL. Normal appearance/no adnexal mass. Left ovary Measurements: 3.8 x 2.6 x 2.8 cm = volume: 14.0 mL. Normal appearance/no adnexal mass. Corpus luteum cyst measuring up to 2.2 cm. Pulsed Doppler evaluation of both ovaries demonstrates normal low-resistance arterial and venous waveforms. Other findings No abnormal free fluid. IMPRESSION: 1. No evidence of ovarian torsion. 2. Heterogeneous endometrium with mobile debris, likely blood products. Electronically Signed   By: Allegra Lai M.D.   On: 04/10/2023 16:01   US Transvaginal Non-OB  Result Date: 04/10/2023 CLINICAL DATA:  Pelvic pain EXAM: TRANSABDOMINAL AND TRANSVAGINAL ULTRASOUND OF PELVIS DOPPLER ULTRASOUND OF OVARIES TECHNIQUE: Both transabdominal and transvaginal ultrasound examinations of the pelvis were performed. Transabdominal technique was performed for global imaging of the pelvis including uterus, ovaries, adnexal regions, and pelvic cul-de-sac. It was necessary to proceed with endovaginal exam following the transabdominal exam to visualize the uterus and ovaries. Color and duplex Doppler ultrasound was utilized to evaluate blood flow to the ovaries. COMPARISON:  None Available. FINDINGS: Uterus Measurements: 7.4 x 4.4 x 5.3 cm = volume: 93.1 mL. Retroverted. No fibroids or other mass visualized. Endometrium Thickness: 8 mm.   Heterogeneous with mobile debris. Right ovary Measurements: 4.0 x 2.7 x 1.9 cm = volume: 10.6 mL. Normal appearance/no adnexal mass. Left ovary Measurements: 3.8 x 2.6 x 2.8 cm = volume: 14.0 mL. Normal appearance/no adnexal mass. Corpus luteum cyst measuring up to 2.2 cm. Pulsed Doppler evaluation of both ovaries demonstrates normal low-resistance arterial and venous waveforms. Other findings No abnormal free fluid. IMPRESSION: 1. No evidence of ovarian torsion. 2. Heterogeneous endometrium with mobile debris, likely blood products. Electronically Signed   By: Allegra Lai M.D.   On: 04/10/2023 16:01   [place] Art/Ven Flow Abd Pelv Doppler  Result Date: 04/10/2023 CLINICAL DATA:  Pelvic pain EXAM: TRANSABDOMINAL AND TRANSVAGINAL ULTRASOUND OF PELVIS DOPPLER ULTRASOUND OF OVARIES TECHNIQUE: Both transabdominal and transvaginal ultrasound examinations of the pelvis were performed. Transabdominal technique was performed for global imaging of the pelvis including  uterus, ovaries, adnexal regions, and pelvic cul-de-sac. It was necessary to proceed with endovaginal exam following the transabdominal exam to visualize the uterus and ovaries. Color and duplex Doppler ultrasound was utilized to evaluate blood flow to the ovaries. COMPARISON:  None Available. FINDINGS: Uterus Measurements: 7.4 x 4.4 x 5.3 cm = volume: 93.1 mL. Retroverted. No fibroids or other mass visualized. Endometrium Thickness: 8 mm.  Heterogeneous with mobile debris. Right ovary Measurements: 4.0 x 2.7 x 1.9 cm = volume: 10.6 mL. Normal appearance/no adnexal mass. Left ovary Measurements: 3.8 x 2.6 x 2.8 cm = volume: 14.0 mL. Normal appearance/no adnexal mass. Corpus luteum cyst measuring up to 2.2 cm. Pulsed Doppler evaluation of both ovaries demonstrates normal low-resistance arterial and venous waveforms. Other findings No abnormal free fluid. IMPRESSION: 1. No evidence of ovarian torsion. 2. Heterogeneous endometrium with mobile debris, likely  blood products. Electronically Signed   By: Allegra Lai M.D.   On: 04/10/2023 16:01    Procedures Procedures    Medications Ordered in ED Medications  dextrose 5 % and 0.9 % NaCl with KCl 20 mEq/L infusion (has no administration in time range)  morphine (PF) 4 MG/ML injection 4 mg (4 mg Intravenous Given 04/10/23 1504)  morphine (PF) 4 MG/ML injection 4 mg (4 mg Intravenous Given 04/10/23 1739)  iohexol (OMNIPAQUE) 300 MG/ML solution 100 mL (100 mLs Intravenous Contrast Given 04/10/23 1740)  sodium chloride 0.9 % bolus 1,000 mL (1,000 mLs Intravenous New Bag/Given 04/10/23 1930)  morphine (PF) 4 MG/ML injection 4 mg (4 mg Intravenous Given 04/10/23 1931)  piperacillin-tazobactam (ZOSYN) IVPB 3.375 g (3.375 g Intravenous New Bag/Given 04/10/23 2003)    ED Course/ Medical Decision Making/ A&P Clinical Course as of 04/10/23 2107  Wed Apr 10, 2023  1938 Spoke with general surgery Dr. Luisa Hart.  He will evaluate the patient for admission. [AS]    Clinical Course User Index [AS] Stellan Vick, Edsel Petrin, PA-C                             Medical Decision Making Amount and/or Complexity of Data Reviewed Labs: ordered. Radiology: ordered.  Risk Prescription drug management. Decision regarding hospitalization.  This patient presents to the ED for concern of lower abdominal pain, this involves an extensive number of treatment options, and is a complaint that carries with it a high risk of complications and morbidity.  Differential diagnosis of her lower abdominal considerations include pelvic inflammatory disease, ectopic pregnancy, appendicitis, urinary calculi, primary dysmenorrhea, septic abortion, ruptured ovarian cyst or tumor, ovarian torsion, tubo-ovarian abscess, degeneration of fibroid, endometriosis, diverticulitis, cystitis.   Co morbidities that complicate the patient evaluation   elective abortion via D&C in March  My initial workup includes labs, imaging, pain control  Additional  history obtained from: Nursing notes from this visit. Previous records within EMR system urgent care visit yesterday  I ordered, reviewed and interpreted labs which include: CBC, CMP, lipase, hCG.  Hypokalemia of 3.0.  Leukocytosis of 14.6.  hCG of 22  I ordered imaging studies including pelvic ultrasound, CT abdomen pelvis I independently visualized and interpreted imaging which showed fluid in the uterus thought to be blood with thickened endometrium I agree with the radiologist interpretation  Consultations Obtained:  I requested consultation with general surgery Dr. Luisa Hart,  and discussed lab and imaging findings as well as pertinent plan - they recommend: Evaluation for admission  I requested consultation with gynecology at the request of general surgery  regarding patient's positive i-STAT hCG.  Spoke with Dr. Lorane Gell. Elevated hCG likely secondary to her recent abortion.  She is cleared from an abdominal surgical standpoint.  Avoid the uterus as much as possible.  [facility] trend hCG level.  I relayed this information to the patient.  Afebrile, hemodynamically stable.  27 year old female presenting to the ED for evaluation of lower abdominal pain.  This began 3 days ago.  It was initially concerned for endometritis versus retained products of conception given that she underwent an elective abortion.  Dilation and curettage in late March and has had 2 weeks of light vaginal bleeding.  On exam, she does have multiple peritoneal signs.  She appears uncomfortable.  Her lab workup was significant for leukocytosis of 14.6, hypokalemia 3.0.  Imaging as described above.  I have high clinical concern for appendicitis given the constellation of findings.  I did consider pelvic inflammatory disease, however she does not have cervical motion or left adnexal tenderness.  Her pain appears to be localized more to the right side of her lower abdomen.  She has also not  been sexually active since prior to the abortion in March.  Endometritis was also on the differential list, however would be in atypical presentation given the patient's timeline of symptoms and dilation and curettage.  I consulted with general surgery who will evaluate the patient at bedside for admission.  Stable at the time of admission.  Patient's case discussed with Dr. Jearld Fenton who agrees with plan to admit to surgery.   Note: Portions of this report may have been transcribed using voice recognition software. Every effort was made to ensure accuracy; however, inadvertent computerized transcription errors may still be present.        Final Clinical Impression(s) / ED Diagnoses Final diagnoses:  Acute appendicitis, unspecified acute appendicitis type    Rx / DC Orders ED Discharge Orders     None         Michelle Piper, Cordelia Poche 04/10/23 2107    Loetta Rough, MD 04/10/23 [phone]

## 2023-04-11 DIAGNOSIS — R109 Unspecified abdominal pain: Secondary | ICD-10-CM | POA: Diagnosis present

## 2023-04-11 LAB — RPR: RPR Ser Ql: NONREACTIVE

## 2023-04-11 LAB — CBC
HCT: 32.4 % — ABNORMAL LOW (ref 36.0–46.0)
Hemoglobin: 10.7 g/dL — ABNORMAL LOW (ref 12.0–15.0)
MCH: 32.4 pg (ref 26.0–34.0)
MCHC: 33 g/dL (ref 30.0–36.0)
MCV: 98.2 fL (ref 80.0–100.0)
Platelets: 160 10*3/uL (ref 150–400)
RBC: 3.3 MIL/uL — ABNORMAL LOW (ref 3.87–5.11)
RDW: 12.4 % (ref 11.5–15.5)
WBC: 12.5 10*3/uL — ABNORMAL HIGH (ref 4.0–10.5)
nRBC: 0 % (ref 0.0–0.2)

## 2023-04-11 LAB — COMPREHENSIVE METABOLIC PANEL
ALT: 10 U/L (ref 0–44)
AST: 10 U/L — ABNORMAL LOW (ref 15–41)
Albumin: 3.3 g/dL — ABNORMAL LOW (ref 3.5–5.0)
Alkaline Phosphatase: 43 U/L (ref 38–126)
Anion gap: 7 (ref 5–15)
BUN: 5 mg/dL — ABNORMAL LOW (ref 6–20)
CO2: 23 mmol/L (ref 22–32)
Calcium: 8.1 mg/dL — ABNORMAL LOW (ref 8.9–10.3)
Chloride: 105 mmol/L (ref 98–111)
Creatinine, Ser: 0.71 mg/dL (ref 0.44–1.00)
GFR, Estimated: >60 mL/min (ref >60)
Glucose, Bld: 96 mg/dL (ref 70–99)
Potassium: 3.3 mmol/L — ABNORMAL LOW (ref 3.5–5.1)
Sodium: 135 mmol/L (ref 135–145)
Total Bilirubin: 0.8 mg/dL (ref 0.3–1.2)
Total Protein: 6.2 g/dL — ABNORMAL LOW (ref 6.5–8.1)

## 2023-04-11 LAB — HCG, QUANTITATIVE, PREGNANCY: hCG, Beta Chain, Quant, S: 1 m[IU]/mL (ref <5)

## 2023-04-11 LAB — HIV ANTIBODY (ROUTINE TESTING W REFLEX): HIV Screen 4th Generation wRfx: NONREACTIVE

## 2023-04-11 LAB — GC/CHLAMYDIA PROBE AMP (~~LOC~~) NOT AT ARMC
Chlamydia: NEGATIVE
Comment: NEGATIVE
Comment: NORMAL
Neisseria Gonorrhea: POSITIVE — AB

## 2023-04-11 MED ORDER — ONDANSETRON 4 MG PO TBDP: 4.0000 mg | ORAL_TABLET | Freq: Four times a day (QID) | ORAL | Status: DC | PRN

## 2023-04-11 MED ORDER — ENOXAPARIN SODIUM 40 MG/0.4ML IJ SOSY
40.0000 mg | PREFILLED_SYRINGE | INTRAMUSCULAR | Status: DC
  Administered 2023-04-11: 40 mg via SUBCUTANEOUS
  Filled 2023-04-11: qty 0.4

## 2023-04-11 MED ORDER — ONDANSETRON HCL 4 MG/2ML IJ SOLN
4.0000 mg | Freq: Once | INTRAMUSCULAR | Status: AC
  Administered 2023-04-11: 4 mg via INTRAVENOUS
  Filled 2023-04-11: qty 2

## 2023-04-11 MED ORDER — HYDROMORPHONE HCL 1 MG/ML IJ SOLN
1.0000 mg | Freq: Once | INTRAMUSCULAR | Status: AC
  Administered 2023-04-11: 1 mg via INTRAVENOUS
  Filled 2023-04-11: qty 1

## 2023-04-11 MED ORDER — PIPERACILLIN-TAZOBACTAM 3.375 G IVPB
3.3750 g | Freq: Three times a day (TID) | INTRAVENOUS | Status: DC
  Administered 2023-04-11: 3.375 g via INTRAVENOUS
  Filled 2023-04-11: qty 50

## 2023-04-11 MED ORDER — PROCHLORPERAZINE EDISYLATE 10 MG/2ML IJ SOLN
5.0000 mg | INTRAMUSCULAR | Status: DC | PRN
  Administered 2023-04-11 – 2023-04-12 (×2): 10 mg via INTRAVENOUS
  Filled 2023-04-11 (×2): qty 2

## 2023-04-11 MED ORDER — ZOLPIDEM TARTRATE 5 MG PO TABS: 5.0000 mg | ORAL_TABLET | Freq: Every evening | ORAL | Status: DC | PRN

## 2023-04-11 MED ORDER — LORAZEPAM 0.5 MG PO TABS
0.5000 mg | ORAL_TABLET | Freq: Three times a day (TID) | ORAL | Status: DC | PRN
  Filled 2023-04-11: qty 1

## 2023-04-11 MED ORDER — METRONIDAZOLE 500 MG/100ML IV SOLN
500.0000 mg | Freq: Two times a day (BID) | INTRAVENOUS | Status: DC
  Administered 2023-04-11 – 2023-04-12 (×3): 500 mg via INTRAVENOUS
  Filled 2023-04-11 (×3): qty 100

## 2023-04-11 MED ORDER — ACETAMINOPHEN 650 MG RE SUPP: 650.0000 mg | Freq: Four times a day (QID) | RECTAL | Status: DC | PRN

## 2023-04-11 MED ORDER — ACETAMINOPHEN 325 MG PO TABS: 650.0000 mg | ORAL_TABLET | Freq: Four times a day (QID) | ORAL | Status: DC | PRN

## 2023-04-11 MED ORDER — HYDROMORPHONE HCL 1 MG/ML IJ SOLN
1.0000 mg | INTRAMUSCULAR | Status: DC | PRN
  Administered 2023-04-11 – 2023-04-12 (×4): 1 mg via INTRAVENOUS
  Filled 2023-04-11 (×4): qty 1

## 2023-04-11 MED ORDER — POTASSIUM CHLORIDE 10 MEQ/100ML IV SOLN
10.0000 meq | INTRAVENOUS | Status: AC
  Administered 2023-04-11 (×3): 10 meq via INTRAVENOUS
  Filled 2023-04-11 (×3): qty 100

## 2023-04-11 MED ORDER — DOXYCYCLINE HYCLATE 100 MG PO TABS
100.0000 mg | ORAL_TABLET | Freq: Two times a day (BID) | ORAL | Status: DC
  Administered 2023-04-11 – 2023-04-12 (×3): 100 mg via ORAL
  Filled 2023-04-11 (×4): qty 1

## 2023-04-11 MED ORDER — SODIUM CHLORIDE 0.9 % IV SOLN
2.0000 g | INTRAVENOUS | Status: DC
  Administered 2023-04-11 – 2023-04-12 (×2): 2 g via INTRAVENOUS
  Filled 2023-04-11 (×2): qty 20

## 2023-04-11 MED ORDER — POTASSIUM CHLORIDE 20 MEQ PO PACK: 40.0000 meq | PACK | Freq: Once | ORAL | Status: DC

## 2023-04-11 MED ORDER — METOPROLOL TARTRATE 5 MG/5ML IV SOLN: 5.0000 mg | Freq: Four times a day (QID) | INTRAVENOUS | Status: DC | PRN

## 2023-04-11 MED ORDER — ONDANSETRON HCL 4 MG/2ML IJ SOLN
4.0000 mg | Freq: Four times a day (QID) | INTRAMUSCULAR | Status: DC | PRN
  Administered 2023-04-11: 4 mg via INTRAVENOUS
  Filled 2023-04-11: qty 2

## 2023-04-11 NOTE — ED Notes (Signed)
C/o of nausea 

## 2023-04-11 NOTE — ED Notes (Signed)
Patient states she wants to leave and is trying to find a ride. Notified Cornett MD, states it will be AMA if she leaves.

## 2023-04-11 NOTE — Progress Notes (Signed)
[facility] Progress Note     Subjective: CC:  Patient laying in bed - c/o back pain and some lower abd pain. Reports about 1 year of back pain. She tells me that she went to [place] where she underwent elective abortion/D&C 02/22/23. She tells me that she had what she felt to normal post-procedure cramping and bleeding, was able to drive herself home from the procedure. She was told she did not need a scheduled follow up. She said she continued to have vaginal bleeding until about 5-7 days before "the worse period of her life" which started around 03/24/23 -- describes cramping and heavy bleeding/passage of blood clots. This continued until about 5-6 days ago.   For 3-4 days she reports increased abdominal pain over her bladder area, worse with moving, walking, riding in the car. Associated with resumption of vaginal bleeding. She denies vomiting. Denies changes in bowel habits- reports regular, non-bloody stools. Denies recent sexual activity. Denies urinary sxs.  She adds that she does have anxiety and when she starts to get anxious about her medical condition she gets chills.   Objective: Vital signs in last 24 hours: Temp:  [97.8 F (36.6 C)-98.8 F (37.1 C)] 98.5 F (36.9 C) (05/09 0900) Pulse Rate:  [61-105] 86 (05/09 0900) Resp:  [14-23] 14 (05/09 0900) BP: (108-134)/(61-108) 111/62 (05/09 0900) SpO2:  [99 %-100 %] 100 % (05/09 0900) Weight:  [68 kg] 68 kg (05/08 1355)    Intake/Output from previous day: 05/08 0701 - 05/09 0700 In: 1050 [IV Piggyback:1050] Out: -  Intake/Output this shift: Total I/O In: 327.1 [IV Piggyback:327.1] Out: -   PE: Gen:  Alert, NAD, pleasant and cooperative  Card:  Regular rate and rhythm, pedal pulses 2+ BL Pulm:  Normal effort ORA Abd: Soft, mild LUQ tenderness without guarding, there is suprapubic tenderness to mild palpation with voluntary guarding, no RLQ pain.  Skin: warm and dry, no rashes  Psych: A&Ox3   Lab Results:   Recent Labs    04/10/23 1430 04/11/23 0500  WBC 14.6* 12.5*  HGB 12.4 10.7*  HCT 37.5 32.4*  PLT 182 160   BMET Recent Labs    04/10/23 1430 04/11/23 0500  NA 136 135  K 3.0* 3.3*  CL 104 105  CO2 23 23  GLUCOSE 87 96  BUN 7 5*  CREATININE 0.80 0.71  CALCIUM 8.8* 8.1*   PT/INR No results for input(s): "LABPROT", "INR" in the last 72 hours. CMP     Component Value Date/Time   NA 135 04/11/2023 0500   K 3.3 (L) 04/11/2023 0500   CL 105 04/11/2023 0500   CO2 23 04/11/2023 0500   GLUCOSE 96 04/11/2023 0500   BUN 5 (L) 04/11/2023 0500   CREATININE 0.71 04/11/2023 0500   CALCIUM 8.1 (L) 04/11/2023 0500   PROT 6.2 (L) 04/11/2023 0500   ALBUMIN 3.3 (L) 04/11/2023 0500   AST 10 (L) 04/11/2023 0500   ALT 10 04/11/2023 0500   ALKPHOS 43 04/11/2023 0500   BILITOT 0.8 04/11/2023 0500   GFRNONAA >60 04/11/2023 0500   Lipase     Component Value Date/Time   LIPASE 21 04/10/2023 1430       Studies/Results: CT ABDOMEN PELVIS W CONTRAST  Result Date: 04/10/2023 CLINICAL DATA:  RLQ abdominal pain EXAM: CT ABDOMEN AND PELVIS WITH CONTRAST TECHNIQUE: Multidetector CT imaging of the abdomen and pelvis was performed using the standard protocol following bolus administration of intravenous contrast. RADIATION DOSE REDUCTION: This exam was performed  according to the departmental dose-optimization program which includes automated exposure control, adjustment of the mA and/or kV according to patient size and/or use of iterative reconstruction technique. CONTRAST:  OMNIPAQUE IOHEXOL 300 MG/ML  SOLN COMPARISON:  Pelvic ultrasound earlier today, abdominopelvic CT 07/28/2018 FINDINGS: Lower chest: Clear lung bases. Hepatobiliary: Focal fatty infiltration adjacent to the falciform ligament. No focal liver lesion. Gallbladder physiologically distended, no calcified stone. No biliary dilatation. Pancreas: Unremarkable. No pancreatic ductal dilatation or surrounding inflammatory changes.  Spleen: Normal in size without focal abnormality. Adrenals/Urinary Tract: Normal adrenal glands. No hydronephrosis or perinephric edema. Homogeneous renal enhancement. No renal stone or focal renal abnormality. Urinary bladder is minimally distended, normal for degree of distension. Stomach/Bowel: The appendix is upper normal at 7 mm. Minimal intraluminal fluid but no periappendiceal fat stranding or inflammation. No hyperemia. No appendicolith. There is air within the distal appendix. No bowel obstruction or inflammation. Small volume of formed stool in the colon. Vascular/Lymphatic: No acute vascular findings. Normal caliber abdominal aorta. Patent portal vein. No enlarged lymph nodes in the abdomen or pelvis. Reproductive: Assessed on pelvic ultrasound earlier today. Retroverted uterus. Physiologic left ovary corpus luteal cyst. Other: No free air or free fluid. Musculoskeletal: There are no acute or suspicious osseous abnormalities. IMPRESSION: 1. Upper normal appendix at 7 mm with minimal intraluminal fluid but no periappendiceal fat stranding or inflammation. Findings are equivocal for early acute appendicitis in the appropriate clinical setting. 2. No other acute abnormality in the abdomen/pelvis. Electronically Signed   By: Narda Rutherford M.D.   On: 04/10/2023 18:10   US Pelvis Complete  Result Date: 04/10/2023 CLINICAL DATA:  Pelvic pain EXAM: TRANSABDOMINAL AND TRANSVAGINAL ULTRASOUND OF PELVIS DOPPLER ULTRASOUND OF OVARIES TECHNIQUE: Both transabdominal and transvaginal ultrasound examinations of the pelvis were performed. Transabdominal technique was performed for global imaging of the pelvis including uterus, ovaries, adnexal regions, and pelvic cul-de-sac. It was necessary to proceed with endovaginal exam following the transabdominal exam to visualize the uterus and ovaries. Color and duplex Doppler ultrasound was utilized to evaluate blood flow to the ovaries. COMPARISON:  None Available.  FINDINGS: Uterus Measurements: 7.4 x 4.4 x 5.3 cm = volume: 93.1 mL. Retroverted. No fibroids or other mass visualized. Endometrium Thickness: 8 mm.  Heterogeneous with mobile debris. Right ovary Measurements: 4.0 x 2.7 x 1.9 cm = volume: 10.6 mL. Normal appearance/no adnexal mass. Left ovary Measurements: 3.8 x 2.6 x 2.8 cm = volume: 14.0 mL. Normal appearance/no adnexal mass. Corpus luteum cyst measuring up to 2.2 cm. Pulsed Doppler evaluation of both ovaries demonstrates normal low-resistance arterial and venous waveforms. Other findings No abnormal free fluid. IMPRESSION: 1. No evidence of ovarian torsion. 2. Heterogeneous endometrium with mobile debris, likely blood products. Electronically Signed   By: Allegra Lai M.D.   On: 04/10/2023 16:01   US Transvaginal Non-OB  Result Date: 04/10/2023 CLINICAL DATA:  Pelvic pain EXAM: TRANSABDOMINAL AND TRANSVAGINAL ULTRASOUND OF PELVIS DOPPLER ULTRASOUND OF OVARIES TECHNIQUE: Both transabdominal and transvaginal ultrasound examinations of the pelvis were performed. Transabdominal technique was performed for global imaging of the pelvis including uterus, ovaries, adnexal regions, and pelvic cul-de-sac. It was necessary to proceed with endovaginal exam following the transabdominal exam to visualize the uterus and ovaries. Color and duplex Doppler ultrasound was utilized to evaluate blood flow to the ovaries. COMPARISON:  None Available. FINDINGS: Uterus Measurements: 7.4 x 4.4 x 5.3 cm = volume: 93.1 mL. Retroverted. No fibroids or other mass visualized. Endometrium Thickness: 8 mm.  Heterogeneous with mobile  debris. Right ovary Measurements: 4.0 x 2.7 x 1.9 cm = volume: 10.6 mL. Normal appearance/no adnexal mass. Left ovary Measurements: 3.8 x 2.6 x 2.8 cm = volume: 14.0 mL. Normal appearance/no adnexal mass. Corpus luteum cyst measuring up to 2.2 cm. Pulsed Doppler evaluation of both ovaries demonstrates normal low-resistance arterial and venous waveforms. Other  findings No abnormal free fluid. IMPRESSION: 1. No evidence of ovarian torsion. 2. Heterogeneous endometrium with mobile debris, likely blood products. Electronically Signed   By: Allegra Lai M.D.   On: 04/10/2023 16:01   [place] Art/Ven Flow Abd Pelv Doppler  Result Date: 04/10/2023 CLINICAL DATA:  Pelvic pain EXAM: TRANSABDOMINAL AND TRANSVAGINAL ULTRASOUND OF PELVIS DOPPLER ULTRASOUND OF OVARIES TECHNIQUE: Both transabdominal and transvaginal ultrasound examinations of the pelvis were performed. Transabdominal technique was performed for global imaging of the pelvis including uterus, ovaries, adnexal regions, and pelvic cul-de-sac. It was necessary to proceed with endovaginal exam following the transabdominal exam to visualize the uterus and ovaries. Color and duplex Doppler ultrasound was utilized to evaluate blood flow to the ovaries. COMPARISON:  None Available. FINDINGS: Uterus Measurements: 7.4 x 4.4 x 5.3 cm = volume: 93.1 mL. Retroverted. No fibroids or other mass visualized. Endometrium Thickness: 8 mm.  Heterogeneous with mobile debris. Right ovary Measurements: 4.0 x 2.7 x 1.9 cm = volume: 10.6 mL. Normal appearance/no adnexal mass. Left ovary Measurements: 3.8 x 2.6 x 2.8 cm = volume: 14.0 mL. Normal appearance/no adnexal mass. Corpus luteum cyst measuring up to 2.2 cm. Pulsed Doppler evaluation of both ovaries demonstrates normal low-resistance arterial and venous waveforms. Other findings No abnormal free fluid. IMPRESSION: 1. No evidence of ovarian torsion. 2. Heterogeneous endometrium with mobile debris, likely blood products. Electronically Signed   By: Allegra Lai M.D.   On: 04/10/2023 16:01    Anti-infectives: Anti-infectives (From admission, onward)    Start     Dose/Rate Route Frequency Ordered Stop   04/11/23 0800  cefTRIAXone (ROCEPHIN) 2 g in sodium chloride 0.9 % 100 mL IVPB        2 g 200 mL/hr over 30 Minutes Intravenous Every 24 hours 04/11/23 0746     04/11/23 0800   metroNIDAZOLE (FLAGYL) IVPB 500 mg        500 mg 100 mL/hr over 60 Minutes Intravenous Every 12 hours 04/11/23 0746     04/11/23 0345  piperacillin-tazobactam (ZOSYN) IVPB 3.375 g  Status:  Discontinued        3.375 g 12.5 mL/hr over 240 Minutes Intravenous Every 8 hours 04/11/23 0337 04/11/23 0746   04/10/23 1945  piperacillin-tazobactam (ZOSYN) IVPB 3.375 g        3.375 g 100 mL/hr over 30 Minutes Intravenous  Once 04/10/23 1936 04/10/23 2033        Assessment/Plan  26 y/o F almost 7 weeks s/p D&C who presents with 3-4 days of suprapubic abdominal pain, worse with movement, associated with vaginal bleeding. Workup significant for a leukocytosis of 14.6 (improved to 12.5 today). CT abdomen/pelvis read as equivocal for early acute appendicitis. Vaginal ultrasound shows mobile blood products in endometrium, no evidence of ovarian torsion. Wet prep positive for trichomonas and Gonorrhea/chlamydia are pending. Hcg in ED was 22.   Based on her history, exam, and above subjective data I suspect she has PID and have a low suspicion for appendicitis. Recommend observation overnight with empiric antibiotics to cover both PID and appendicitis. I did request a consult from GYN and spoke to the on call provider, I believe it is  Dr. Alysia Penna, who agreed to see the patient as soon as possible. His recommendation based on patients history was to follow up G/C test and treat for PID with 10 days of doxycycline and 5 days of flagyl. He recommended outpatient follow up with GYN.   Failure to improve or clinical deterioration would warrant diagnostic laparoscopy, possible appendectomy.   FEN: Reg diet, NPO after MN, IVF 75 mL/hr ID: Rocephin/Flagyl, Doxycycline  VTE: SCD's, lovenox Foley:none Dispo: admit to observation, IV antibiotics       LOS: 1 day   I reviewed nursing notes, ED provider notes, last 24 h vitals and pain scores, last 48 h intake and output, last 24 h labs and trends, and last 24 h  imaging results.  This care required moderate level of medical decision making.   Hosie Spangle, PA-C [facility] Please see Amion for pager number during day hours 7:00am-4:30pm

## 2023-04-11 NOTE — ED Notes (Signed)
Report given to [facility], nurse accepting patient at [facility]. [facility] called at this time for transport.

## 2023-04-11 NOTE — ED Notes (Signed)
Pt vomiting in restroom at this time.  PA aware.

## 2023-04-11 NOTE — ED Notes (Signed)
Per Simaan PA (gen surgery) cancel transport and will manage patient at [facility] long.

## 2023-04-11 NOTE — ED Notes (Signed)
Patient states she is waiting to hear back from her mom about if she should leave or not.

## 2023-04-11 NOTE — ED Notes (Signed)
Patient states she is still waiting to hear back from her mom, but willing to get abx now and to [facility] to [facility] at [facility] with bed assigned.

## 2023-04-12 LAB — CBC
HCT: 32.5 % — ABNORMAL LOW (ref 36.0–46.0)
Hemoglobin: 10.5 g/dL — ABNORMAL LOW (ref 12.0–15.0)
MCH: 31.9 pg (ref 26.0–34.0)
MCHC: 32.3 g/dL (ref 30.0–36.0)
MCV: 98.8 fL (ref 80.0–100.0)
Platelets: 159 10*3/uL (ref 150–400)
RBC: 3.29 MIL/uL — ABNORMAL LOW (ref 3.87–5.11)
RDW: 12.1 % (ref 11.5–15.5)
WBC: 4.8 10*3/uL (ref 4.0–10.5)
nRBC: 0 % (ref 0.0–0.2)

## 2023-04-12 LAB — BASIC METABOLIC PANEL
Anion gap: 6 (ref 5–15)
BUN: <5 mg/dL — ABNORMAL LOW (ref 6–20)
CO2: 25 mmol/L (ref 22–32)
Calcium: 8.2 mg/dL — ABNORMAL LOW (ref 8.9–10.3)
Chloride: 105 mmol/L (ref 98–111)
Creatinine, Ser: 0.76 mg/dL (ref 0.44–1.00)
GFR, Estimated: >60 mL/min (ref >60)
Glucose, Bld: 96 mg/dL (ref 70–99)
Potassium: 3.7 mmol/L (ref 3.5–5.1)
Sodium: 136 mmol/L (ref 135–145)

## 2023-04-12 MED ORDER — DOXYCYCLINE HYCLATE 100 MG PO TABS: 100.0000 mg | ORAL_TABLET | Freq: Two times a day (BID) | ORAL | 0 refills | Status: AC

## 2023-04-12 MED ORDER — METRONIDAZOLE 500 MG PO TABS: 500.0000 mg | ORAL_TABLET | Freq: Three times a day (TID) | ORAL | 0 refills | Status: AC

## 2023-04-12 NOTE — Plan of Care (Signed)
Tolerated regular breakfast tray.  D/C orders received, reviewed with patient. Has GYN appt June 18.

## 2023-04-12 NOTE — Progress Notes (Signed)
Subjective: Feels a little better today, pain improving but still endorses some suprapubic pressure. WBC down to 4. Afebrile. No vomiting overnight. Gonorrhea positive.   Objective: Vital signs in last 24 hours: Temp:  [98.1 F (36.7 C)-98.9 F (37.2 C)] 98.4 F (36.9 C) (05/10 0607) Pulse Rate:  [57-86] 71 (05/10 0607) Resp:  [14-18] 17 (05/10 0607) BP: (109-125)/(62-81) 125/69 (05/10 0607) SpO2:  [99 %-100 %] 99 % (05/10 0607) Last BM Date : 04/09/23  Intake/Output from previous day: 05/09 0701 - 05/10 0700 In: 1616.1 [I.V.:1089; IV Piggyback:527.1] Out: -  Intake/Output this shift: No intake/output data recorded.  PE: General: resting comfortably, NAD Neuro: alert and oriented, no focal deficits Resp: normal work of breathing on room air Abdomen: soft, nondistended, mildly tender across lower abdomen and periumbilical area. Extremities: warm and well-perfused   Lab Results:  Recent Labs    04/11/23 0500 04/12/23 0549  WBC 12.5* 4.8  HGB 10.7* 10.5*  HCT 32.4* 32.5*  PLT 160 159   BMET Recent Labs    04/11/23 0500 04/12/23 0549  NA 135 136  K 3.3* 3.7  CL 105 105  CO2 23 25  GLUCOSE 96 96  BUN 5* <5*  CREATININE 0.71 0.76  CALCIUM 8.1* 8.2*   PT/INR No results for input(s): "LABPROT", "INR" in the last 72 hours. CMP     Component Value Date/Time   NA 136 04/12/2023 0549   K 3.7 04/12/2023 0549   CL 105 04/12/2023 0549   CO2 25 04/12/2023 0549   GLUCOSE 96 04/12/2023 0549   BUN <5 (L) 04/12/2023 0549   CREATININE 0.76 04/12/2023 0549   CALCIUM 8.2 (L) 04/12/2023 0549   PROT 6.2 (L) 04/11/2023 0500   ALBUMIN 3.3 (L) 04/11/2023 0500   AST 10 (L) 04/11/2023 0500   ALT 10 04/11/2023 0500   ALKPHOS 43 04/11/2023 0500   BILITOT 0.8 04/11/2023 0500   GFRNONAA >60 04/12/2023 0549   Lipase     Component Value Date/Time   LIPASE 21 04/10/2023 1430       Studies/Results: CT ABDOMEN PELVIS W CONTRAST  Result Date:  04/10/2023 CLINICAL DATA:  RLQ abdominal pain EXAM: CT ABDOMEN AND PELVIS WITH CONTRAST TECHNIQUE: Multidetector CT imaging of the abdomen and pelvis was performed using the standard protocol following bolus administration of intravenous contrast. RADIATION DOSE REDUCTION: This exam was performed according to the departmental dose-optimization program which includes automated exposure control, adjustment of the mA and/or kV according to patient size and/or use of iterative reconstruction technique. CONTRAST:  OMNIPAQUE IOHEXOL 300 MG/ML  SOLN COMPARISON:  Pelvic ultrasound earlier today, abdominopelvic CT 07/28/2018 FINDINGS: Lower chest: Clear lung bases. Hepatobiliary: Focal fatty infiltration adjacent to the falciform ligament. No focal liver lesion. Gallbladder physiologically distended, no calcified stone. No biliary dilatation. Pancreas: Unremarkable. No pancreatic ductal dilatation or surrounding inflammatory changes. Spleen: Normal in size without focal abnormality. Adrenals/Urinary Tract: Normal adrenal glands. No hydronephrosis or perinephric edema. Homogeneous renal enhancement. No renal stone or focal renal abnormality. Urinary bladder is minimally distended, normal for degree of distension. Stomach/Bowel: The appendix is upper normal at 7 mm. Minimal intraluminal fluid but no periappendiceal fat stranding or inflammation. No hyperemia. No appendicolith. There is air within the distal appendix. No bowel obstruction or inflammation. Small volume of formed stool in the colon. Vascular/Lymphatic: No acute vascular findings. Normal caliber abdominal aorta. Patent portal vein. No enlarged lymph nodes in the abdomen or pelvis. Reproductive: Assessed on pelvic ultrasound  earlier today. Retroverted uterus. Physiologic left ovary corpus luteal cyst. Other: No free air or free fluid. Musculoskeletal: There are no acute or suspicious osseous abnormalities. IMPRESSION: 1. Upper normal appendix at 7 mm with  minimal intraluminal fluid but no periappendiceal fat stranding or inflammation. Findings are equivocal for early acute appendicitis in the appropriate clinical setting. 2. No other acute abnormality in the abdomen/pelvis. Electronically Signed   By: Narda Rutherford M.D.   On: 04/10/2023 18:10   US Pelvis Complete  Result Date: 04/10/2023 CLINICAL DATA:  Pelvic pain EXAM: TRANSABDOMINAL AND TRANSVAGINAL ULTRASOUND OF PELVIS DOPPLER ULTRASOUND OF OVARIES TECHNIQUE: Both transabdominal and transvaginal ultrasound examinations of the pelvis were performed. Transabdominal technique was performed for global imaging of the pelvis including uterus, ovaries, adnexal regions, and pelvic cul-de-sac. It was necessary to proceed with endovaginal exam following the transabdominal exam to visualize the uterus and ovaries. Color and duplex Doppler ultrasound was utilized to evaluate blood flow to the ovaries. COMPARISON:  None Available. FINDINGS: Uterus Measurements: 7.4 x 4.4 x 5.3 cm = volume: 93.1 mL. Retroverted. No fibroids or other mass visualized. Endometrium Thickness: 8 mm.  Heterogeneous with mobile debris. Right ovary Measurements: 4.0 x 2.7 x 1.9 cm = volume: 10.6 mL. Normal appearance/no adnexal mass. Left ovary Measurements: 3.8 x 2.6 x 2.8 cm = volume: 14.0 mL. Normal appearance/no adnexal mass. Corpus luteum cyst measuring up to 2.2 cm. Pulsed Doppler evaluation of both ovaries demonstrates normal low-resistance arterial and venous waveforms. Other findings No abnormal free fluid. IMPRESSION: 1. No evidence of ovarian torsion. 2. Heterogeneous endometrium with mobile debris, likely blood products. Electronically Signed   By: Allegra Lai M.D.   On: 04/10/2023 16:01   US Transvaginal Non-OB  Result Date: 04/10/2023 CLINICAL DATA:  Pelvic pain EXAM: TRANSABDOMINAL AND TRANSVAGINAL ULTRASOUND OF PELVIS DOPPLER ULTRASOUND OF OVARIES TECHNIQUE: Both transabdominal and transvaginal ultrasound examinations of  the pelvis were performed. Transabdominal technique was performed for global imaging of the pelvis including uterus, ovaries, adnexal regions, and pelvic cul-de-sac. It was necessary to proceed with endovaginal exam following the transabdominal exam to visualize the uterus and ovaries. Color and duplex Doppler ultrasound was utilized to evaluate blood flow to the ovaries. COMPARISON:  None Available. FINDINGS: Uterus Measurements: 7.4 x 4.4 x 5.3 cm = volume: 93.1 mL. Retroverted. No fibroids or other mass visualized. Endometrium Thickness: 8 mm.  Heterogeneous with mobile debris. Right ovary Measurements: 4.0 x 2.7 x 1.9 cm = volume: 10.6 mL. Normal appearance/no adnexal mass. Left ovary Measurements: 3.8 x 2.6 x 2.8 cm = volume: 14.0 mL. Normal appearance/no adnexal mass. Corpus luteum cyst measuring up to 2.2 cm. Pulsed Doppler evaluation of both ovaries demonstrates normal low-resistance arterial and venous waveforms. Other findings No abnormal free fluid. IMPRESSION: 1. No evidence of ovarian torsion. 2. Heterogeneous endometrium with mobile debris, likely blood products. Electronically Signed   By: Allegra Lai M.D.   On: 04/10/2023 16:01   [place] Art/Ven Flow Abd Pelv Doppler  Result Date: 04/10/2023 CLINICAL DATA:  Pelvic pain EXAM: TRANSABDOMINAL AND TRANSVAGINAL ULTRASOUND OF PELVIS DOPPLER ULTRASOUND OF OVARIES TECHNIQUE: Both transabdominal and transvaginal ultrasound examinations of the pelvis were performed. Transabdominal technique was performed for global imaging of the pelvis including uterus, ovaries, adnexal regions, and pelvic cul-de-sac. It was necessary to proceed with endovaginal exam following the transabdominal exam to visualize the uterus and ovaries. Color and duplex Doppler ultrasound was utilized to evaluate blood flow to the ovaries. COMPARISON:  None Available. FINDINGS: Uterus Measurements:  7.4 x 4.4 x 5.3 cm = volume: 93.1 mL. Retroverted. No fibroids or other mass visualized.  Endometrium Thickness: 8 mm.  Heterogeneous with mobile debris. Right ovary Measurements: 4.0 x 2.7 x 1.9 cm = volume: 10.6 mL. Normal appearance/no adnexal mass. Left ovary Measurements: 3.8 x 2.6 x 2.8 cm = volume: 14.0 mL. Normal appearance/no adnexal mass. Corpus luteum cyst measuring up to 2.2 cm. Pulsed Doppler evaluation of both ovaries demonstrates normal low-resistance arterial and venous waveforms. Other findings No abnormal free fluid. IMPRESSION: 1. No evidence of ovarian torsion. 2. Heterogeneous endometrium with mobile debris, likely blood products. Electronically Signed   By: Allegra Lai M.D.   On: 04/10/2023 16:01       Assessment/Plan  27 yo female 7 weeks s/p D&C admitted with suprapubic abdominal pain and vaginal bleeding. CT was equivocal for acute appendicitis. Her symptom pattern is not consistent with acute appendicitis; given positive trichomonas and gonorrhea, this is more consistent with PID. - Continue doxycycline and flagyl per GYN recommendations. Plan for total 10 days of doxy and 5 days flagyl. - Regular diet, SLIV - Pain and nausea control - If tolerating PO intake and symptoms controlled, possible discharge home this afternoon. Patient will need outpatient GYN follow up. All questions were answered.    LOS: 1 day    Sophronia Simas, MD [facility] Surgery General, Hepatobiliary and Pancreatic Surgery 04/12/23 8:51 AM

## 2023-04-12 NOTE — Progress Notes (Signed)
  Transition of Care [facility]) Screening Note   Patient Details  Name: Erin Bowman Date of Birth: 07-14-1996   Transition of Care [facility]) CM/SW Contact:    Otelia Santee, LCSW Phone Number: 04/12/2023, 10:08 AM    Transition of Care Department [facility]) has reviewed patient and no TOC needs have been identified at this time. We will continue to monitor patient advancement through interdisciplinary progression rounds. If new patient transition needs arise, please place a TOC consult.

## 2023-04-12 NOTE — Discharge Summary (Signed)
    Patient ID: Erin Bowman [id number] 02/09/1996 26 y.o.  Admit date: 04/10/2023 Discharge date: 04/12/2023  Admitting Diagnosis: Abdominal pain, possible appendicitis vs PID   Discharge Diagnosis Patient Active Problem List   Diagnosis Date Noted   Abdominal pain 04/11/2023  PID Gonorrhea  Consultants Phone consult with Dr. Alysia Penna  Reason for Admission: 27 year old female with a 3-day history of lower abdominal pain. She states she has had abdominal pain off and on for a total of a year. She is 6 weeks status post D&C for a 15-week pregnancy. She has had some vaginal bleeding really since then. She is also having a menstrual period about 2 weeks ago. She was seen in [place] yesterday and was sent for further workup. Her lower abdominal pain worsened and she sought care in the emergency room. She complains of lower abdominal pain. It slightly worse on the right than the left. But states her entire abdomen is sore. She is lying in bed in the fetal position. No nausea or vomiting. She has had this pain before but its gotten much worse this time. CT scan showed a mildly thickened appendix. There are changes of previous D&C. She had an ultrasound of her pelvis which showed no ovarian torsion. There is no evidence of any retained products in the uterus nor an intrauterine pregnancy. Her beta hCG was mildly elevated. Her potassium was low. Denies any blood in her stool. No dysuria. Denies any recent sexual activity.   Procedures none  Hospital Course:  The patient was admitted and placed on abx therapy.  Further lab testing revealed patient was positive for gonorrhea and her symptoms were more consistent with PID than appendicitis.  Her pain improved as well as her nausea with abx therapy.  GYN was contacted on 2 different occasions and made verbal recommendations.  The patient was able to tolerated a solid diet on HD 2 and was stable for DC home on doxy and flagyl. She will follow up with  GYN as an outpatient.   Allergies as of 04/12/2023   No Known Allergies      Medication List     TAKE these medications    acetaminophen 500 MG tablet Commonly known as: TYLENOL Take 1,000 mg by mouth every 8 (eight) hours as needed for fever or moderate pain.   doxycycline 100 MG tablet Commonly known as: VIBRA-TABS Take 1 tablet (100 mg total) by mouth every 12 (twelve) hours for 10 days.   ibuprofen 200 MG tablet Commonly known as: ADVIL Take 800 mg by mouth every 8 (eight) hours as needed for mild pain or moderate pain.   metroNIDAZOLE 500 MG tablet Commonly known as: Flagyl Take 1 tablet (500 mg total) by mouth 3 (three) times daily for 7 days.          Follow-up Information     [facility] Follow up.   Why: Their office should call you with a follow up appointment                Signed: Barnetta Chapel, [facility] 04/12/2023, 10:44 AM Please see Amion for pager number during day hours 7:00am-4:30pm, 7-11:30am on Weekends

## 2023-05-21 ENCOUNTER — Ambulatory Visit: Payer: Managed Care, Other (non HMO) | Admitting: Obstetrics and Gynecology
